# Patient Record
Sex: Female | Born: 1955 | Race: Black or African American | Hispanic: Yes | Smoking: Never smoker
Health system: Southern US, Community
[De-identification: ages and names within clinical notes are randomized; demographics above are authoritative.]

## PROBLEM LIST (undated history)

## (undated) DIAGNOSIS — I509 Heart failure, unspecified: Secondary | ICD-10-CM

## (undated) DIAGNOSIS — K219 Gastro-esophageal reflux disease without esophagitis: Secondary | ICD-10-CM

## (undated) DIAGNOSIS — I209 Angina pectoris, unspecified: Secondary | ICD-10-CM

## (undated) DIAGNOSIS — M1990 Unspecified osteoarthritis, unspecified site: Secondary | ICD-10-CM

## (undated) DIAGNOSIS — N189 Chronic kidney disease, unspecified: Secondary | ICD-10-CM

## (undated) DIAGNOSIS — I10 Essential (primary) hypertension: Secondary | ICD-10-CM

## (undated) DIAGNOSIS — E119 Type 2 diabetes mellitus without complications: Secondary | ICD-10-CM

## (undated) DIAGNOSIS — I2510 Atherosclerotic heart disease of native coronary artery without angina pectoris: Secondary | ICD-10-CM

## (undated) DIAGNOSIS — I219 Acute myocardial infarction, unspecified: Secondary | ICD-10-CM

## (undated) HISTORY — PX: EYE SURGERY: SHX253

## (undated) HISTORY — PX: CARDIAC CATHETERIZATION: SHX172

---

## 2006-04-06 DIAGNOSIS — Z90710 Acquired absence of both cervix and uterus: Secondary | ICD-10-CM | POA: Insufficient documentation

## 2007-04-30 ENCOUNTER — Inpatient Hospital Stay: Payer: Self-pay | Admitting: Internal Medicine

## 2007-04-30 ENCOUNTER — Other Ambulatory Visit: Payer: Self-pay

## 2009-03-18 HISTORY — PX: AV FISTULA PLACEMENT: SHX1204

## 2009-09-12 ENCOUNTER — Ambulatory Visit: Payer: Self-pay | Admitting: *Deleted

## 2009-09-15 ENCOUNTER — Ambulatory Visit: Payer: Self-pay | Admitting: *Deleted

## 2012-03-03 ENCOUNTER — Emergency Department: Payer: Self-pay | Admitting: Emergency Medicine

## 2012-06-24 DIAGNOSIS — N289 Disorder of kidney and ureter, unspecified: Secondary | ICD-10-CM | POA: Insufficient documentation

## 2013-08-16 DIAGNOSIS — I519 Heart disease, unspecified: Secondary | ICD-10-CM | POA: Insufficient documentation

## 2013-11-12 DIAGNOSIS — Z7682 Awaiting organ transplant status: Secondary | ICD-10-CM | POA: Insufficient documentation

## 2013-12-08 DIAGNOSIS — J309 Allergic rhinitis, unspecified: Secondary | ICD-10-CM | POA: Insufficient documentation

## 2013-12-08 DIAGNOSIS — J3489 Other specified disorders of nose and nasal sinuses: Secondary | ICD-10-CM | POA: Insufficient documentation

## 2015-07-11 ENCOUNTER — Other Ambulatory Visit: Payer: Self-pay | Admitting: Vascular Surgery

## 2015-07-11 NOTE — H&P (Signed)
  [facility] History & Physical Update  The patient was interviewed and re-examined.  The patient's previous History and Physical has been reviewed and is unchanged.  There is no change in the plan of care. We plan to proceed with the scheduled procedure.  Bexleigh Theriault, Dolores Lory, MD  07/11/2015, 5:42 PM

## 2015-07-12 ENCOUNTER — Ambulatory Visit
Admission: RE | Admit: 2015-07-12 | Discharge: 2015-07-12 | Disposition: A | Discharge status: Home / Self Care | Payer: Medicare Other | Source: Ambulatory Visit | Attending: Vascular Surgery | Admitting: Vascular Surgery

## 2015-07-12 ENCOUNTER — Encounter
Admission: RE | Disposition: A | Discharge status: Home / Self Care | Payer: Self-pay | Source: Ambulatory Visit | Attending: Vascular Surgery

## 2015-07-12 DIAGNOSIS — Z91041 Radiographic dye allergy status: Secondary | ICD-10-CM | POA: Diagnosis not present

## 2015-07-12 DIAGNOSIS — Z79899 Other long term (current) drug therapy: Secondary | ICD-10-CM | POA: Insufficient documentation

## 2015-07-12 DIAGNOSIS — E1122 Type 2 diabetes mellitus with diabetic chronic kidney disease: Secondary | ICD-10-CM | POA: Insufficient documentation

## 2015-07-12 DIAGNOSIS — Z7982 Long term (current) use of aspirin: Secondary | ICD-10-CM | POA: Insufficient documentation

## 2015-07-12 DIAGNOSIS — I509 Heart failure, unspecified: Secondary | ICD-10-CM | POA: Diagnosis not present

## 2015-07-12 DIAGNOSIS — Z992 Dependence on renal dialysis: Secondary | ICD-10-CM | POA: Insufficient documentation

## 2015-07-12 DIAGNOSIS — I252 Old myocardial infarction: Secondary | ICD-10-CM | POA: Diagnosis not present

## 2015-07-12 DIAGNOSIS — T82858A Stenosis of vascular prosthetic devices, implants and grafts, initial encounter: Secondary | ICD-10-CM | POA: Insufficient documentation

## 2015-07-12 DIAGNOSIS — I132 Hypertensive heart and chronic kidney disease with heart failure and with stage 5 chronic kidney disease, or end stage renal disease: Secondary | ICD-10-CM | POA: Insufficient documentation

## 2015-07-12 DIAGNOSIS — Z6832 Body mass index (BMI) 32.0-32.9, adult: Secondary | ICD-10-CM | POA: Diagnosis not present

## 2015-07-12 DIAGNOSIS — Y832 Surgical operation with anastomosis, bypass or graft as the cause of abnormal reaction of the patient, or of later complication, without mention of misadventure at the time of the procedure: Secondary | ICD-10-CM | POA: Diagnosis not present

## 2015-07-12 DIAGNOSIS — Z794 Long term (current) use of insulin: Secondary | ICD-10-CM | POA: Diagnosis not present

## 2015-07-12 DIAGNOSIS — N186 End stage renal disease: Secondary | ICD-10-CM | POA: Insufficient documentation

## 2015-07-12 DIAGNOSIS — Z91013 Allergy to seafood: Secondary | ICD-10-CM | POA: Insufficient documentation

## 2015-07-12 HISTORY — DX: Acute myocardial infarction, unspecified: I21.9

## 2015-07-12 HISTORY — DX: Essential (primary) hypertension: I10

## 2015-07-12 HISTORY — DX: Heart failure, unspecified: I50.9

## 2015-07-12 HISTORY — DX: Chronic kidney disease, unspecified: N18.9

## 2015-07-12 HISTORY — PX: PERIPHERAL VASCULAR CATHETERIZATION: SHX172C

## 2015-07-12 HISTORY — DX: Type 2 diabetes mellitus without complications: E11.9

## 2015-07-12 LAB — GLUCOSE, CAPILLARY: Glucose-Capillary: 231 mg/dL — ABNORMAL HIGH (ref 65–99)

## 2015-07-12 SURGERY — A/V SHUNTOGRAM/FISTULAGRAM
Anesthesia: Moderate Sedation

## 2015-07-12 MED ORDER — SODIUM CHLORIDE 0.9 % IV SOLN: INTRAVENOUS | Status: DC

## 2015-07-12 MED ORDER — MIDAZOLAM HCL 5 MG/5ML IJ SOLN
INTRAMUSCULAR | Status: AC
  Filled 2015-07-12: qty 5

## 2015-07-12 MED ORDER — FENTANYL CITRATE (PF) 100 MCG/2ML IJ SOLN
INTRAMUSCULAR | Status: DC | PRN
  Administered 2015-07-12: 50 ug via INTRAVENOUS

## 2015-07-12 MED ORDER — LIDOCAINE HCL (PF) 1 % IJ SOLN
INTRAMUSCULAR | Status: DC | PRN
  Administered 2015-07-12: 5 mL

## 2015-07-12 MED ORDER — IOPAMIDOL (ISOVUE-300) INJECTION 61%
INTRAVENOUS | Status: DC | PRN
  Administered 2015-07-12: 20 mL via INTRA_ARTERIAL

## 2015-07-12 MED ORDER — MIDAZOLAM HCL 2 MG/2ML IJ SOLN
INTRAMUSCULAR | Status: DC | PRN
  Administered 2015-07-12: 2 mg via INTRAVENOUS

## 2015-07-12 MED ORDER — DEXTROSE 5 % IV SOLN
1.5000 g | INTRAVENOUS | Status: AC
  Administered 2015-07-12: 1.5 g via INTRAVENOUS

## 2015-07-12 MED ORDER — FAMOTIDINE 20 MG PO TABS: 40.0000 mg | ORAL_TABLET | ORAL | Status: DC | PRN

## 2015-07-12 MED ORDER — ONDANSETRON HCL 4 MG/2ML IJ SOLN: 4.0000 mg | Freq: Four times a day (QID) | INTRAMUSCULAR | Status: DC | PRN

## 2015-07-12 MED ORDER — FENTANYL CITRATE (PF) 100 MCG/2ML IJ SOLN
INTRAMUSCULAR | Status: AC
  Filled 2015-07-12: qty 2

## 2015-07-12 MED ORDER — LIDOCAINE HCL (PF) 1 % IJ SOLN
INTRAMUSCULAR | Status: AC
  Filled 2015-07-12: qty 30

## 2015-07-12 MED ORDER — HEPARIN SODIUM (PORCINE) 1000 UNIT/ML IJ SOLN
INTRAMUSCULAR | Status: AC
  Filled 2015-07-12: qty 1

## 2015-07-12 MED ORDER — HEPARIN SODIUM (PORCINE) 1000 UNIT/ML IJ SOLN
INTRAMUSCULAR | Status: DC | PRN
  Administered 2015-07-12: 3000 [IU] via INTRAVENOUS

## 2015-07-12 MED ORDER — METHYLPREDNISOLONE SODIUM SUCC 125 MG IJ SOLR: 125.0000 mg | INTRAMUSCULAR | Status: DC | PRN

## 2015-07-12 MED ORDER — HYDROMORPHONE HCL 1 MG/ML IJ SOLN: 1.0000 mg | Freq: Once | INTRAMUSCULAR | Status: DC

## 2015-07-12 SURGICAL SUPPLY — 11 items
BALLN DORADO 8X60X80 (BALLOONS) ×3
BALLOON DORADO 8X60X80 (BALLOONS) ×1 IMPLANT
DEVICE PRESTO INFLATION (MISCELLANEOUS) ×3 IMPLANT
DRAPE BRACHIAL (DRAPES) ×3 IMPLANT
GRAFT STENT FLAIR ENDOVAS 9X50 (Permanent Stent) ×3 IMPLANT
PACK ANGIOGRAPHY (CUSTOM PROCEDURE TRAY) ×3 IMPLANT
SET INTRO CAPELLA COAXIAL (SET/KITS/TRAYS/PACK) ×3 IMPLANT
SHEATH BRITE TIP 6FRX5.5 (SHEATH) ×3 IMPLANT
SHEATH BRITE TIP 7FRX5.5 (SHEATH) ×3 IMPLANT
TOWEL OR 17X26 4PK STRL BLUE (TOWEL DISPOSABLE) ×3 IMPLANT
WIRE MAGIC TORQUE 260C (WIRE) ×3 IMPLANT

## 2015-07-12 NOTE — Discharge Instructions (Signed)
Fistulogram, Care After °Refer to this sheet in the next few weeks. These instructions provide you with information on caring for yourself after your procedure. Your health care provider may also give you more specific instructions. Your treatment has been planned according to current medical practices, but problems sometimes occur. Call your health care provider if you have any problems or questions after your procedure. °WHAT TO EXPECT AFTER THE PROCEDURE °After your procedure, it is typical to have the following: °· A small amount of discomfort in the area where the catheters were placed. °· A small amount of bruising around the fistula. °· Sleepiness and fatigue. °HOME CARE INSTRUCTIONS °· Rest at home for the day following your procedure. °· Do not drive or operate heavy machinery while taking pain medicine. °· Take medicines only as directed by your health care provider. °· Do not take baths, swim, or use a hot tub until your health care provider approves. You may shower 24 hours after the procedure or as directed by your health care provider. °· There are many different ways to close and cover an incision, including stitches, skin glue, and adhesive strips. Follow your health care provider's instructions on: °¨ Incision care. °¨ Bandage (dressing) changes and removal. °¨ Incision closure removal. °· Monitor your dialysis fistula carefully. °SEEK MEDICAL CARE IF: °· You have drainage, redness, swelling, or pain at your catheter site. °· You have a fever. °· You have chills. °SEEK IMMEDIATE MEDICAL CARE IF: °· You feel weak. °· You have trouble balancing. °· You have trouble moving your arms or legs. °· You have problems with your speech or vision. °· You can no longer feel a vibration or buzz when you put your fingers over your dialysis fistula. °· The limb that was used for the procedure: °¨ Swells. °¨ Is painful. °¨ Is cold. °¨ Is discolored, such as blue or pale white. °  °This information is not intended  to replace advice given to you by your health care provider. Make sure you discuss any questions you have with your health care provider. °  °Document Released: 07/19/2013 Document Reviewed: 07/19/2013 °Elsevier Interactive Patient Education ©2016 [employer] ° °

## 2015-07-12 NOTE — Op Note (Signed)
OPERATIVE NOTE   PROCEDURE: 1. Contrast injection left arm basilic transposition  AV access 2. Percutaneous transluminal angioplasty and stent placement venous outflow left basilic transposition  PRE-OPERATIVE DIAGNOSIS: Complication of dialysis access                                                       End Stage Renal Disease  POST-OPERATIVE DIAGNOSIS: same as above   SURGEON: Katha Cabal, M.D.  ANESTHESIA: Conscious Sedation   ESTIMATED BLOOD LOSS: minimal  FINDING(S): 1. 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 female who  presents with malfunctioning left arm AV access.  The patient is scheduled for angiography with possible intervention of the AV access.  The patient is aware the risks include but are not limited to: bleeding, infection, thrombosis of the cannulated access, and possible anaphylactic reaction to the contrast.  The patient acknowledges if the access can not be salvaged a tunneled catheter will be needed and will be placed during this procedure.  The patient is aware of the risks of the procedure and elects to proceed with the angiogram and intervention.  DESCRIPTION: After full informed written consent was obtained, the patient was brought back to the Special Procedure suite and placed supine position.  Appropriate cardiopulmonary monitors were placed.  The left arm was prepped and draped in the standard fashion.  Appropriate timeout is called. The left basilic transposition  was cannulated with a micropuncture needle in the antegrade direction.  The microwire was advanced and the needle was exchanged for  a microsheath.  The J-wire was then advanced and a 6 Fr sheath inserted.  Hand injections were completed to image the access from the arterial anastomosis through the entire access.  The central venous structures were  also imaged by hand injections.  Based on the images,  3000 units of heparin was given and a wire was negotiated through the strictures within the venous portion of the graft.  An 9 mm x 50 mm Flair Stent was deployed across the stenoses and postdilated with an 8 mm [place] balloon.  Follow-up imaging demonstrates complete resolution of the stricture with rapid flow of contrast through the graft, the central venous anatomy is preserved.  A 4-0 Monocryl purse-string suture was sewn around the sheath.  The sheath was removed and light pressure was applied.  A sterile bandage was applied to the puncture site.  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 The more proximal axillary vein subclavian and innominate superior vena cava widely patent. Reflux images demonstrates moderate stenosis at the origin of the fistula visualized portions of the brachial artery are widely patent. Anastomosis is widely patent.  Following and plasty and stent placement there is wide patency of the basilic brachial confluence and a marked improvement in the quality of the palpable thrill. Given her history of steal syndrome and prior banding I elected not to treat the arterial portion at this time.    COMPLICATIONS: None  CONDITION: Sally Lynch, M.D [facility] Office: [phone]  07/12/2015 9:07 AM

## 2015-07-20 NOTE — H&P (Signed)
[facility] Admission History & Physical  MRN : [id number]  Sally Lynch Sally Lynch is a 60 y.o. (03/06/1956) female who presents with chief complaint of problems with my fistula.  History of Present Illness: The patient is sent by her dialysis center for evaluation of her access.  She is having increased bleeding and worsening of her dialysis parameters.  No fever or chills.  She denies hand pain.  No current facility-administered medications for this encounter.   Current Outpatient Prescriptions  Medication Sig Dispense Refill  . aspirin 81 MG tablet Take 81 mg by mouth daily.    Marland Kitchen atorvastatin (LIPITOR) 40 MG tablet Take 40 mg by mouth daily.    . bumetanide (BUMEX) 1 MG tablet Take 1 mg by mouth 2 (two) times daily.    . cholecalciferol (VITAMIN D) 1000 units tablet Take 1,000 Units by mouth daily.    . cinacalcet (SENSIPAR) 60 MG tablet Take 60 mg by mouth daily.    . fluticasone (FLONASE) 50 MCG/ACT nasal spray Place into both nostrils 2 (two) times daily.    Marland Kitchen gabapentin (NEURONTIN) 300 MG capsule Take 300 mg by mouth daily.    . hydrOXYzine (ATARAX/VISTARIL) 25 MG tablet Take 25 mg by mouth 3 (three) times daily as needed.    . insulin glargine (LANTUS) 100 UNIT/ML injection Inject 20 Units into the skin at bedtime.    . metoprolol succinate (TOPROL-XL) 25 MG 24 hr tablet Take 25 mg by mouth 3 (three) times daily.    . pantoprazole (PROTONIX) 40 MG tablet Take 40 mg by mouth daily.    . sevelamer (RENAGEL) 800 MG tablet Take by mouth 3 (three) times daily with meals.      Past Medical History  Diagnosis Date  . Myocardial infarction ([place])   . CHF (congestive heart failure) ([place])   . Diabetes mellitus without complication ([place])   . Hypertension   . Chronic kidney disease     Past Surgical History  Procedure Laterality Date  . Cardiac catheterization    . Peripheral vascular catheterization N/A 07/12/2015    Procedure: A/V Shuntogram/Fistulagram;   Surgeon: Katha Cabal, MD;  Location: [facility];  Service: Cardiovascular;  Laterality: N/A;    Social History Social History  Substance Use Topics  . Smoking status: Never Smoker   . Smokeless tobacco: None  . Alcohol Use: No    Family History History reviewed. No pertinent family history.No family history of bleeding or clotting disorders, no porphyria, no autoimmune disease  Allergies  Allergen Reactions  . Shellfish Allergy Hives  . Contrast Media [Iodinated Diagnostic Agents] Hives     REVIEW OF SYSTEMS (Negative unless checked)  Constitutional: [] Weight loss  [] Fever  [] Chills Cardiac: [] Chest pain   [] Chest pressure   [] Palpitations   [] Shortness of breath when laying flat   [] Shortness of breath at rest   [] Shortness of breath with exertion. Vascular:  [] Pain in legs with walking   [] Pain in legs at rest   [] Pain in legs when laying flat   [] Claudication   [] Pain in feet when walking  [] Pain in feet at rest  [] Pain in feet when laying flat   [] History of DVT   [] Phlebitis   [] Swelling in legs   [] Varicose veins   [] Non-healing ulcers Pulmonary:   [] Uses home oxygen   [] Productive cough   [] Hemoptysis   [] Wheeze  [] COPD   [] Asthma Neurologic:  [] Dizziness  [] Blackouts   [] Seizures   []   History of stroke   [] History of TIA  [] Aphasia   [] Temporary blindness   [] Dysphagia   [] Weakness or numbness in arms   [] Weakness or numbness in legs Musculoskeletal:  [] Arthritis   [] Joint swelling   [] Joint pain   [] Low back pain Hematologic:  [] Easy bruising  [] Easy bleeding   [] Hypercoagulable state   [] Anemic  [] Hepatitis Gastrointestinal:  [] Blood in stool   [] Vomiting blood  [] Gastroesophageal reflux/heartburn   [] Difficulty swallowing. Genitourinary:  [] Chronic kidney disease   [] Difficult urination  [] Frequent urination  [] Burning with urination   [] Blood in urine Skin:  [] Rashes   [] Ulcers   [] Wounds Psychological:  [] History of anxiety   []  History of major  depression.  Physical Examination  Filed Vitals:   07/12/15 0930 07/12/15 0945 07/12/15 1000 07/12/15 1030  BP: 147/76 151/72 147/76 109/86  Pulse: 65 74 76 76  Temp:      TempSrc:      Resp: 14 18 14 16   Height:      Weight:      SpO2: 78%      Body mass index is 32.08 kg/(m^2). Gen: WD/WN, NAD Head: [place]/AT, No temporalis wasting.  Ear/Nose/Throat: Hearing grossly intact, nares w/o erythema or drainage, oropharynx w/o Erythema/Exudate, Eyes: PERRLA, EOMI.  Neck: Supple, no nuchal rigidity.  No bruit or JVD.  Pulmonary:  Good air movement, clear to auscultation bilaterally, no increased work of respiration or use of accessory muscles  Cardiac: RRR, normal S1, S2, no Murmurs, rubs or gallops. Vascular: left basilic transposition markedly pulsatile  Vessel Right Left  Radial Palpable Palpable  Ulnar Palpable Palpable  Brachial Palpable Palpable  Carotid Palpable, without bruit Palpable, without bruit  Aorta Not palpable N/A  Femoral Palpable Palpable  Popliteal Palpable Palpable  PT Palpable Palpable  DP Palpable Palpable   Gastrointestinal: soft, non-tender/non-distended. No guarding/reflex. No masses, surgical incisions, or scars. Musculoskeletal: M/S 5/5 throughout.  No deformity or atrophy. Neurologic: CN 2-12 intact. Pain and light touch intact in extremities.  Symmetrical.  Speech is fluent. Motor exam as listed above. Psychiatric: Judgment intact, Mood & affect appropriate for pt's clinical situation. Dermatologic: No rashes or ulcers noted.  No cellulitis or open wounds. Lymph : No Cervical, Axillary, or Inguinal lymphadenopathy.   CBC No results found for: WBC, HGB, HCT, MCV, PLT  BMET No results found for: NA, K, CL, CO2, GLUCOSE, BUN, CREATININE, CALCIUM, GFRNONAA, GFRAA CrCl cannot be calculated (Patient has no serum creatinine result on file.).  COAG No results found for: INR, PROTIME  Radiology No results found.  Assessment/Plan  1.  Complication  dialysis device with poor function AV access:  Patient's left arm dialysis access is functioning poorly. The patient will undergo angiography and treatment using interventional techniques. Potassium will be drawn to ensure that it is an appropriate level prior to performing intervention. 2.  End-stage renal disease requiring hemodialysis:  Patient will continue dialysis therapy without further interruption if a successful intervention is not achieved then catheter will be placed. Dialysis has already been arranged since the patient missed their previous session 3.  Hypertension:  Patient will continue medical management; nephrology is following no changes in oral medications. 4. Diabetes mellitus:  Glucose will be monitored and oral medications been held this morning once the patient has undergone the patient's procedure po intake will be reinitiated and again Accu-Cheks will be used to assess the blood glucose level and treat as needed. The patient will be restarted on the patient's usual hypoglycemic regime  Sally Lynch, Dolores Lory, MD  07/20/2015 4:09 PM

## 2015-07-24 DIAGNOSIS — E1165 Type 2 diabetes mellitus with hyperglycemia: Secondary | ICD-10-CM | POA: Insufficient documentation

## 2016-01-10 ENCOUNTER — Other Ambulatory Visit (INDEPENDENT_AMBULATORY_CARE_PROVIDER_SITE_OTHER): Payer: Self-pay | Admitting: Vascular Surgery

## 2016-01-10 DIAGNOSIS — Z992 Dependence on renal dialysis: Secondary | ICD-10-CM

## 2016-01-10 DIAGNOSIS — T829XXA Unspecified complication of cardiac and vascular prosthetic device, implant and graft, initial encounter: Secondary | ICD-10-CM

## 2016-01-10 DIAGNOSIS — N186 End stage renal disease: Secondary | ICD-10-CM

## 2016-01-11 ENCOUNTER — Ambulatory Visit (INDEPENDENT_AMBULATORY_CARE_PROVIDER_SITE_OTHER): Payer: Self-pay | Admitting: Vascular Surgery

## 2016-01-11 ENCOUNTER — Ambulatory Visit (INDEPENDENT_AMBULATORY_CARE_PROVIDER_SITE_OTHER): Payer: Medicare Other

## 2016-01-11 DIAGNOSIS — Z992 Dependence on renal dialysis: Secondary | ICD-10-CM

## 2016-01-11 DIAGNOSIS — T829XXA Unspecified complication of cardiac and vascular prosthetic device, implant and graft, initial encounter: Secondary | ICD-10-CM

## 2016-01-11 DIAGNOSIS — N186 End stage renal disease: Secondary | ICD-10-CM | POA: Diagnosis not present

## 2016-01-18 ENCOUNTER — Encounter (INDEPENDENT_AMBULATORY_CARE_PROVIDER_SITE_OTHER): Payer: Self-pay | Admitting: Vascular Surgery

## 2016-01-18 ENCOUNTER — Ambulatory Visit (INDEPENDENT_AMBULATORY_CARE_PROVIDER_SITE_OTHER): Payer: Medicare Other | Admitting: Vascular Surgery

## 2016-01-18 DIAGNOSIS — E782 Mixed hyperlipidemia: Secondary | ICD-10-CM | POA: Diagnosis not present

## 2016-01-18 DIAGNOSIS — T829XXA Unspecified complication of cardiac and vascular prosthetic device, implant and graft, initial encounter: Secondary | ICD-10-CM | POA: Insufficient documentation

## 2016-01-18 DIAGNOSIS — E119 Type 2 diabetes mellitus without complications: Secondary | ICD-10-CM

## 2016-01-18 DIAGNOSIS — I10 Essential (primary) hypertension: Secondary | ICD-10-CM

## 2016-01-18 DIAGNOSIS — T829XXS Unspecified complication of cardiac and vascular prosthetic device, implant and graft, sequela: Secondary | ICD-10-CM

## 2016-01-18 DIAGNOSIS — N186 End stage renal disease: Secondary | ICD-10-CM | POA: Diagnosis not present

## 2016-01-18 DIAGNOSIS — Z794 Long term (current) use of insulin: Secondary | ICD-10-CM

## 2016-01-18 DIAGNOSIS — E785 Hyperlipidemia, unspecified: Secondary | ICD-10-CM | POA: Insufficient documentation

## 2016-02-23 NOTE — Progress Notes (Signed)
MRN : [id number]  Sally Lynch is a 60 y.o. ([birth year]) female who presents with chief complaint of  Chief Complaint  Patient presents with  . Re-evaluation    Follow up on HDA, questions  .  History of Present Illness: The patient returns to the office for followup of their dialysis access. The function of the access has been stable. The patient denies increased bleeding time or increased recirculation. Patient denies difficulty with cannulation. The patient denies hand pain or other symptoms consistent with steal phenomena.  No significant arm swelling.  The patient denies redness or swelling at the access site. The patient denies fever or chills at home or while on dialysis.  The patient denies amaurosis fugax or recent TIA symptoms. There are no recent neurological changes noted. The patient denies claudication symptoms or rest pain symptoms. The patient denies history of DVT, PE or superficial thrombophlebitis. The patient denies recent episodes of angina or shortness of breath.        Current Meds  Medication Sig  . aspirin 81 MG tablet Take 81 mg by mouth daily.  Marland Kitchen atorvastatin (LIPITOR) 20 MG tablet Take 20 mg by mouth daily.  . cholecalciferol (VITAMIN D) 1000 units tablet Take 1,000 Units by mouth daily.  . cinacalcet (SENSIPAR) 90 MG tablet Take 90 mg by mouth daily.  . fluticasone (FLONASE) 50 MCG/ACT nasal spray Place into both nostrils 2 (two) times daily.  Marland Kitchen gabapentin (NEURONTIN) 300 MG capsule Take 300 mg by mouth daily.  . hydrOXYzine (ATARAX/VISTARIL) 25 MG tablet Take 25 mg by mouth 3 (three) times daily as needed.  . insulin glargine (LANTUS) 100 UNIT/ML injection Inject 20 Units into the skin at bedtime.  Marland Kitchen losartan (COZAAR) 25 MG tablet Take 25 mg by mouth daily.  . metoprolol succinate (TOPROL-XL) 25 MG 24 hr tablet Take 25 mg by mouth 3 (three) times daily.  . pantoprazole (PROTONIX) 40 MG tablet Take 40 mg by mouth daily.  . sevelamer  (RENAGEL) 800 MG tablet Take by mouth 3 (three) times daily with meals.    Past Medical History:  Diagnosis Date  . CHF (congestive heart failure) ([place])   . Chronic kidney disease   . Diabetes mellitus without complication ([place])   . Hypertension   . Myocardial infarction     Past Surgical History:  Procedure Laterality Date  . CARDIAC CATHETERIZATION    . PERIPHERAL VASCULAR CATHETERIZATION N/A 07/12/2015   Procedure: A/V Shuntogram/Fistulagram;  Surgeon: Katha Cabal, MD;  Location: [facility];  Service: Cardiovascular;  Laterality: N/A;    Social History Social History  Substance Use Topics  . Smoking status: Never Smoker  . Smokeless tobacco: Not on file  . Alcohol use No    Family History No family history on file. No family history of bleeding/clotting disorders, porphyria or autoimmune disease   Allergies  Allergen Reactions  . Shellfish Allergy Hives  . Contrast Media [Iodinated Diagnostic Agents] Hives  . Furosemide Rash  . Other Rash    Raw fruits  . Peanut Oil Rash     REVIEW OF SYSTEMS (Negative unless checked)  Constitutional: [] Weight loss  [] Fever  [] Chills Cardiac: [] Chest pain   [] Chest pressure   [] Palpitations   [] Shortness of breath when laying flat   [] Shortness of breath with exertion. Vascular:  [] Pain in legs with walking   [] Pain in legs at rest  [] History of DVT   [] Phlebitis   [] Swelling in legs   []   Varicose veins   [] Non-healing ulcers Pulmonary:   [] Uses home oxygen   [] Productive cough   [] Hemoptysis   [] Wheeze  [] COPD   [] Asthma Neurologic:  [] Dizziness   [] Seizures   [] History of stroke   [] History of TIA  [] Aphasia   [] Vissual changes   [] Weakness or numbness in arm   [] Weakness or numbness in leg Musculoskeletal:   [] Joint swelling   [] Joint pain   [] Low back pain Hematologic:  [] Easy bruising  [] Easy bleeding   [] Hypercoagulable state   [] Anemic Gastrointestinal:  [] Diarrhea   [] Vomiting  [] Gastroesophageal  reflux/heartburn   [] Difficulty swallowing. Genitourinary:  [x] Chronic kidney disease   [] Difficult urination  [] Frequent urination   [] Blood in urine Skin:  [] Rashes   [] Ulcers  Psychological:  [] History of anxiety   []  History of major depression.  Physical Examination  Vitals:   01/18/16 0925  BP: (!) 144/79  Pulse: 71  Resp: 16  Weight: 180 lb (81.6 kg)  Height: 5\' 4"  (1.626 m)   Body mass index is 30.9 kg/m. Gen: WD/WN, NAD Head: [place]/AT, No temporalis wasting.  Ear/Nose/Throat: Hearing grossly intact, nares w/o erythema or drainage, poor dentition Eyes: PER, EOMI, sclera nonicteric.  Neck: Supple, no masses.  No bruit or JVD.  Pulmonary:  Good air movement, clear to auscultation bilaterally, no use of accessory muscles.  Cardiac: RRR, normal S1, S2, no Murmurs. Vascular: AV graft good thrill and good bruit Vessel Right Left  Radial Palpable Palpable  Ulnar Palpable Palpable  Brachial Palpable Palpable  Gastrointestinal: soft, non-distended. No guarding/no peritoneal signs.  Musculoskeletal: M/S 5/5 throughout.  No deformity or atrophy.  Neurologic: CN 2-12 intact. Pain and light touch intact in extremities.  Symmetrical.  Speech is fluent. Motor exam as listed above. Psychiatric: Judgment intact, Mood & affect appropriate for pt's clinical situation. Dermatologic: No rashes or ulcers noted.  No changes consistent with cellulitis. Lymph : No Cervical lymphadenopathy, no lichenification or skin changes of chronic lymphedema.  CBC No results found for: WBC, HGB, HCT, MCV, PLT  BMET No results found for: NA, K, CL, CO2, GLUCOSE, BUN, CREATININE, CALCIUM, GFRNONAA, GFRAA CrCl cannot be calculated (No order found.).  COAG No results found for: INR, PROTIME  Radiology No results found.   Assessment/Plan 1. End stage renal disease ([place]) Recommend:  The patient is doing well and currently has adequate dialysis access. The patient's dialysis center is not reporting  any access issues. Flow pattern is stable when compared to the prior ultrasound.  The patient should have a duplex ultrasound of the dialysis access in 6 months.  The patient will follow-up with me in the office after each ultrasound   2. Complication of vascular access for dialysis, sequela See #1  3. Essential hypertension Continue antihypertensive medications as already ordered and reviewed, no changes at this time.  4. Type 2 diabetes mellitus treated with insulin ([place]) Continue hypoglycemic medications as already ordered and reviewed, no changes at this time.  5. Mixed hyperlipidemia Continue statin as ordered and reviewed, no changes at this time   Hortencia Pilar, MD  02/23/2016 3:58 PM

## 2016-04-19 DIAGNOSIS — S82132A Displaced fracture of medial condyle of left tibia, initial encounter for closed fracture: Secondary | ICD-10-CM | POA: Insufficient documentation

## 2016-06-14 ENCOUNTER — Other Ambulatory Visit
Admission: RE | Admit: 2016-06-14 | Discharge: 2016-06-14 | Disposition: A | Discharge status: Home / Self Care | Payer: Medicare Other | Source: Other Acute Inpatient Hospital | Attending: Nephrology | Admitting: Nephrology

## 2016-06-14 DIAGNOSIS — E875 Hyperkalemia: Secondary | ICD-10-CM | POA: Diagnosis present

## 2016-06-14 LAB — POTASSIUM: Potassium: 6.7 mmol/L (ref 3.5–5.1)

## 2016-06-17 ENCOUNTER — Other Ambulatory Visit
Admission: RE | Admit: 2016-06-17 | Discharge: 2016-06-17 | Disposition: A | Discharge status: Home / Self Care | Payer: Medicare Other | Source: Ambulatory Visit | Attending: Nephrology | Admitting: Nephrology

## 2016-06-17 DIAGNOSIS — E875 Hyperkalemia: Secondary | ICD-10-CM | POA: Insufficient documentation

## 2016-06-17 LAB — POTASSIUM: Potassium: 7 mmol/L (ref 3.5–5.1)

## 2016-06-24 ENCOUNTER — Other Ambulatory Visit
Admission: RE | Admit: 2016-06-24 | Discharge: 2016-06-24 | Disposition: A | Discharge status: Home / Self Care | Payer: Medicare Other | Source: Ambulatory Visit | Attending: Nephrology | Admitting: Nephrology

## 2016-06-24 LAB — POTASSIUM: POTASSIUM: 6.5 mmol/L — AB (ref 3.5–5.1)

## 2016-07-17 ENCOUNTER — Other Ambulatory Visit (INDEPENDENT_AMBULATORY_CARE_PROVIDER_SITE_OTHER): Payer: Self-pay | Admitting: Vascular Surgery

## 2016-07-17 DIAGNOSIS — T8249XA Other complication of vascular dialysis catheter, initial encounter: Secondary | ICD-10-CM

## 2016-07-17 DIAGNOSIS — N186 End stage renal disease: Secondary | ICD-10-CM

## 2016-07-18 ENCOUNTER — Ambulatory Visit (INDEPENDENT_AMBULATORY_CARE_PROVIDER_SITE_OTHER): Payer: Medicare Other | Admitting: Vascular Surgery

## 2016-07-18 ENCOUNTER — Encounter (INDEPENDENT_AMBULATORY_CARE_PROVIDER_SITE_OTHER): Payer: Medicare Other

## 2016-08-08 ENCOUNTER — Ambulatory Visit (INDEPENDENT_AMBULATORY_CARE_PROVIDER_SITE_OTHER): Payer: Medicare Other | Admitting: Vascular Surgery

## 2016-08-08 ENCOUNTER — Encounter (INDEPENDENT_AMBULATORY_CARE_PROVIDER_SITE_OTHER): Payer: Medicare Other

## 2016-09-26 ENCOUNTER — Ambulatory Visit (INDEPENDENT_AMBULATORY_CARE_PROVIDER_SITE_OTHER): Payer: Medicare Other | Admitting: Vascular Surgery

## 2016-09-26 ENCOUNTER — Encounter (INDEPENDENT_AMBULATORY_CARE_PROVIDER_SITE_OTHER): Payer: Medicare Other

## 2016-10-08 ENCOUNTER — Ambulatory Visit (INDEPENDENT_AMBULATORY_CARE_PROVIDER_SITE_OTHER): Payer: Medicare Other | Admitting: Vascular Surgery

## 2016-10-08 ENCOUNTER — Encounter (INDEPENDENT_AMBULATORY_CARE_PROVIDER_SITE_OTHER): Payer: Medicare Other

## 2017-01-24 ENCOUNTER — Other Ambulatory Visit
Admission: RE | Admit: 2017-01-24 | Discharge: 2017-01-24 | Disposition: A | Discharge status: Home / Self Care | Payer: Medicare Other | Source: Ambulatory Visit | Attending: Nephrology | Admitting: Nephrology

## 2017-01-24 DIAGNOSIS — E875 Hyperkalemia: Secondary | ICD-10-CM | POA: Insufficient documentation

## 2017-01-24 LAB — POTASSIUM: POTASSIUM: 6.3 mmol/L — AB (ref 3.5–5.1)

## 2017-08-01 DIAGNOSIS — K219 Gastro-esophageal reflux disease without esophagitis: Secondary | ICD-10-CM | POA: Insufficient documentation

## 2017-10-22 ENCOUNTER — Other Ambulatory Visit (INDEPENDENT_AMBULATORY_CARE_PROVIDER_SITE_OTHER): Payer: Self-pay | Admitting: Vascular Surgery

## 2017-10-22 DIAGNOSIS — I739 Peripheral vascular disease, unspecified: Secondary | ICD-10-CM

## 2017-10-23 ENCOUNTER — Encounter (INDEPENDENT_AMBULATORY_CARE_PROVIDER_SITE_OTHER): Payer: Self-pay | Admitting: Vascular Surgery

## 2017-10-23 ENCOUNTER — Ambulatory Visit (INDEPENDENT_AMBULATORY_CARE_PROVIDER_SITE_OTHER): Payer: Medicare Other

## 2017-10-23 ENCOUNTER — Ambulatory Visit (INDEPENDENT_AMBULATORY_CARE_PROVIDER_SITE_OTHER): Payer: Medicare Other | Admitting: Vascular Surgery

## 2017-10-23 VITALS — BP 128/72 | HR 71 | Resp 12 | Ht 64.0 in | Wt 194.0 lb

## 2017-10-23 DIAGNOSIS — I739 Peripheral vascular disease, unspecified: Secondary | ICD-10-CM | POA: Diagnosis not present

## 2017-10-23 DIAGNOSIS — T829XXS Unspecified complication of cardiac and vascular prosthetic device, implant and graft, sequela: Secondary | ICD-10-CM | POA: Diagnosis not present

## 2017-10-23 DIAGNOSIS — N186 End stage renal disease: Secondary | ICD-10-CM | POA: Diagnosis not present

## 2017-10-23 NOTE — Progress Notes (Signed)
Subjective:    Patient ID: Sally Lynch, female    DOB: Mar 21, 1955, 62 y.o.   MRN: [id number] Chief Complaint  Patient presents with  . Follow-up    HDA [place] follow up   Patient last seen in November 2017.  Patient presents at the request of her dialysis center for a poorly functioning fistula. Patient notes having issues with cannulation and recirculation. The patient also notes at higher dialysis rates she experiences left hand pain.  The patient underwent a left upper extremity dialysis access duplex which was notable for a total flow volume of 1126, patent left brachial basilic AV fistula with outflow vein velocities less than 100.  The patient's access is aneurysmal however she denies any pain or bleeding from the site.  Denies any fever, nausea vomiting.  Review of Systems  Constitutional: Negative.   HENT: Negative.   Eyes: Negative.   Respiratory: Negative.   Cardiovascular: Negative.   Gastrointestinal: Negative.   Endocrine: Negative.   Genitourinary:       ESRD  Musculoskeletal: Negative.   Skin: Negative.   Allergic/Immunologic: Negative.   Neurological: Negative.   Hematological: Negative.   Psychiatric/Behavioral: Negative.       Objective:   Physical Exam  Constitutional: She is oriented to person, place, and time. She appears well-developed and well-nourished. No distress.  HENT:  Head: Normocephalic and atraumatic.  Right Ear: External ear normal.  Left Ear: External ear normal.  Eyes: Pupils are equal, round, and reactive to light. Conjunctivae and EOM are normal.  Neck: Normal range of motion.  Cardiovascular: Normal rate, regular rhythm, normal heart sounds and intact distal pulses.  Pulses:      Radial pulses are 2+ on the right side, and 1+ on the left side.  Left upper extremity dialysis access site: Aneurysmal but skin is intact.  No signs of skin threatening noted.  Good bruit and thrill noted.  Pulmonary/Chest: Effort normal and breath sounds  normal.  Musculoskeletal: Normal range of motion. She exhibits no edema.  Neurological: She is alert and oriented to person, place, and time.  Skin: Skin is warm and dry. She is not diaphoretic.  Psychiatric: She has a normal mood and affect. Her behavior is normal. Judgment and thought content normal.   BP 128/72 (BP Location: Right Arm, Patient Position: Sitting)   Pulse 71   Resp 12   Ht 5\' 4"  (1.626 m)   Wt 194 lb (88 kg)   BMI 33.30 kg/m   Past Medical History:  Diagnosis Date  . CHF (congestive heart failure) ([place])   . Chronic kidney disease   . Diabetes mellitus without complication ([place])   . Hypertension   . Myocardial infarction [facility])    Social History   Socioeconomic History  . Marital status: Widowed    Spouse name: Not on file  . Number of children: Not on file  . Years of education: Not on file  . Highest education level: Not on file  Occupational History  . Not on file  Social Needs  . Financial resource strain: Not on file  . Food insecurity:    Worry: Not on file    Inability: Not on file  . Transportation needs:    Medical: Not on file    Non-medical: Not on file  Tobacco Use  . Smoking status: Never Smoker  . Smokeless tobacco: Never Used  Substance and Sexual Activity  . Alcohol use: No  . Drug use: No  .  Sexual activity: Never  Lifestyle  . Physical activity:    Days per week: Not on file    Minutes per session: Not on file  . Stress: Not on file  Relationships  . Social connections:    Talks on phone: Not on file    Gets together: Not on file    Attends religious service: Not on file    Active member of club or organization: Not on file    Attends meetings of clubs or organizations: Not on file    Relationship status: Not on file  . Intimate partner violence:    Fear of current or ex partner: Not on file    Emotionally abused: Not on file    Physically abused: Not on file    Forced sexual activity: Not on file  Other Topics Concern   . Not on file  Social History Narrative  . Not on file   Past Surgical History:  Procedure Laterality Date  . CARDIAC CATHETERIZATION    . PERIPHERAL VASCULAR CATHETERIZATION N/A 07/12/2015   Procedure: A/V Shuntogram/Fistulagram;  Surgeon: Katha Cabal, MD;  Location: [place] [facility];  Service: Cardiovascular;  Laterality: N/A;   History reviewed. No pertinent family history.  Allergies  Allergen Reactions  . Shellfish Allergy Hives  . Contrast Media [Iodinated Diagnostic Agents] Hives  . Furosemide Rash  . Other Rash    Raw fruits  . Peanut Oil Rash      Assessment & Plan:  Patient last seen in November 2017.  Patient presents at the request of her dialysis center for a poorly functioning fistula. Patient notes having issues with cannulation and recirculation. The patient also notes at higher dialysis rates she experiences left hand pain.  The patient underwent a left upper extremity dialysis access duplex which was notable for a total flow volume of 1126, patent left brachial basilic AV fistula with outflow vein velocities less than 100.  The patient's access is aneurysmal however she denies any pain or bleeding from the site.  Denies any fever, nausea vomiting.  1. End stage renal disease ([place]) - Stable Patient with progressively worsening issues with cannulation, recirculation and left hand pain during dialysis. HDA with outflow vein velocities less than 100. Recommend a left upper extremity fistulogram with possible intervention in an attempt to assess the patient's anatomy and restore function to access Procedure, risks and benefits explained to the patient Questions answered Patient wishes to proceed Patient would like to wait approximately a month and a half to undergo the fistulogram as she is undergoing a cardiac work-up for a recent MI  2. Complication of vascular access for dialysis, sequela - Stable As Above  Current Outpatient Medications on File Prior  to Visit  Medication Sig Dispense Refill  . aspirin 81 MG tablet Take 81 mg by mouth daily.    Marland Kitchen atorvastatin (LIPITOR) 20 MG tablet Take 20 mg by mouth daily.    . calcitRIOL (ROCALTROL) 0.25 MCG capsule Take by mouth.    . cinacalcet (SENSIPAR) 90 MG tablet Take 90 mg by mouth daily.    . clopidogrel (PLAVIX) 75 MG tablet Take 75 mg by mouth daily.    Marland Kitchen gabapentin (NEURONTIN) 300 MG capsule Take 300 mg by mouth daily.    . hydrOXYzine (ATARAX/VISTARIL) 25 MG tablet Take 25 mg by mouth 3 (three) times daily as needed.    . insulin glargine (LANTUS) 100 UNIT/ML injection Inject 20 Units into the skin at bedtime.    Marland Kitchen  lidocaine-prilocaine (EMLA) cream Apply topically.    Marland Kitchen losartan (COZAAR) 25 MG tablet Take 25 mg by mouth daily.    . metoprolol succinate (TOPROL-XL) 25 MG 24 hr tablet Take 12.5 mg by mouth 3 (three) times daily.     . midodrine (PROAMATINE) 5 MG tablet Take 1 tab 1/2 hr before dialysis, take 1 tab mid treatment prn hypotension    . pantoprazole (PROTONIX) 40 MG tablet Take 40 mg by mouth daily.    . sevelamer carbonate (RENVELA) 800 MG tablet Take 2 tabs for 3 meals per day and 1 tabs for 2 snacks per day    . triamcinolone cream (KENALOG) 0.1 % Mix 1:1 w Eucerin cream.  Apply to dry itchy skin twice daily prn    . cholecalciferol (VITAMIN D) 1000 units tablet Take 1,000 Units by mouth daily.    . fluticasone (FLONASE) 50 MCG/ACT nasal spray Place into both nostrils 2 (two) times daily.     No current facility-administered medications on file prior to visit.    There are no Patient Instructions on file for this visit. No follow-ups on file.  KIMBERLY A STEGMAYER, PA-C

## 2017-11-05 ENCOUNTER — Encounter (INDEPENDENT_AMBULATORY_CARE_PROVIDER_SITE_OTHER): Payer: Self-pay

## 2017-11-06 ENCOUNTER — Other Ambulatory Visit (INDEPENDENT_AMBULATORY_CARE_PROVIDER_SITE_OTHER): Payer: Self-pay | Admitting: Nurse Practitioner

## 2017-12-01 ENCOUNTER — Encounter (INDEPENDENT_AMBULATORY_CARE_PROVIDER_SITE_OTHER): Payer: Self-pay

## 2017-12-01 ENCOUNTER — Telehealth (INDEPENDENT_AMBULATORY_CARE_PROVIDER_SITE_OTHER): Payer: Self-pay

## 2017-12-01 NOTE — Telephone Encounter (Signed)
Patient called back and wanted to be scheduled on 10/22.

## 2017-12-01 NOTE — Telephone Encounter (Signed)
Called the patient and left a message regarding her fistulagram with Dr. Delana Meyer tomorrow. She has been rescheduled to 12/09/17 with a 1:30 pm arrival due to an unforseen issue. I have attempted to contact the patient and had to leave a message for a return call. I also contacted the dialysis center she attends.

## 2018-01-05 MED ORDER — CEFAZOLIN SODIUM-DEXTROSE 1-4 GM/50ML-% IV SOLN
1.0000 g | Freq: Once | INTRAVENOUS | Status: AC
  Administered 2018-01-06: 1 g via INTRAVENOUS

## 2018-01-06 ENCOUNTER — Encounter: Payer: Self-pay | Admitting: *Deleted

## 2018-01-06 ENCOUNTER — Encounter
Admission: RE | Disposition: A | Discharge status: Home / Self Care | Payer: Self-pay | Source: Ambulatory Visit | Attending: Vascular Surgery

## 2018-01-06 ENCOUNTER — Ambulatory Visit
Admission: RE | Admit: 2018-01-06 | Discharge: 2018-01-06 | Disposition: A | Discharge status: Home / Self Care | Payer: Medicare Other | Source: Ambulatory Visit | Attending: Vascular Surgery | Admitting: Vascular Surgery

## 2018-01-06 DIAGNOSIS — Y832 Surgical operation with anastomosis, bypass or graft as the cause of abnormal reaction of the patient, or of later complication, without mention of misadventure at the time of the procedure: Secondary | ICD-10-CM | POA: Diagnosis not present

## 2018-01-06 DIAGNOSIS — Z91013 Allergy to seafood: Secondary | ICD-10-CM | POA: Diagnosis not present

## 2018-01-06 DIAGNOSIS — E1122 Type 2 diabetes mellitus with diabetic chronic kidney disease: Secondary | ICD-10-CM | POA: Insufficient documentation

## 2018-01-06 DIAGNOSIS — Z91010 Allergy to peanuts: Secondary | ICD-10-CM | POA: Diagnosis not present

## 2018-01-06 DIAGNOSIS — I120 Hypertensive chronic kidney disease with stage 5 chronic kidney disease or end stage renal disease: Secondary | ICD-10-CM | POA: Diagnosis not present

## 2018-01-06 DIAGNOSIS — K219 Gastro-esophageal reflux disease without esophagitis: Secondary | ICD-10-CM | POA: Insufficient documentation

## 2018-01-06 DIAGNOSIS — T82858A Stenosis of vascular prosthetic devices, implants and grafts, initial encounter: Secondary | ICD-10-CM | POA: Diagnosis present

## 2018-01-06 DIAGNOSIS — M1990 Unspecified osteoarthritis, unspecified site: Secondary | ICD-10-CM | POA: Diagnosis not present

## 2018-01-06 DIAGNOSIS — Z91018 Allergy to other foods: Secondary | ICD-10-CM | POA: Diagnosis not present

## 2018-01-06 DIAGNOSIS — I252 Old myocardial infarction: Secondary | ICD-10-CM | POA: Diagnosis not present

## 2018-01-06 DIAGNOSIS — Z91041 Radiographic dye allergy status: Secondary | ICD-10-CM | POA: Diagnosis not present

## 2018-01-06 DIAGNOSIS — N186 End stage renal disease: Secondary | ICD-10-CM | POA: Diagnosis not present

## 2018-01-06 DIAGNOSIS — I2510 Atherosclerotic heart disease of native coronary artery without angina pectoris: Secondary | ICD-10-CM | POA: Insufficient documentation

## 2018-01-06 DIAGNOSIS — T82868A Thrombosis of vascular prosthetic devices, implants and grafts, initial encounter: Secondary | ICD-10-CM

## 2018-01-06 DIAGNOSIS — I509 Heart failure, unspecified: Secondary | ICD-10-CM | POA: Insufficient documentation

## 2018-01-06 DIAGNOSIS — I132 Hypertensive heart and chronic kidney disease with heart failure and with stage 5 chronic kidney disease, or end stage renal disease: Secondary | ICD-10-CM | POA: Diagnosis not present

## 2018-01-06 DIAGNOSIS — Z9889 Other specified postprocedural states: Secondary | ICD-10-CM | POA: Diagnosis not present

## 2018-01-06 DIAGNOSIS — Z888 Allergy status to other drugs, medicaments and biological substances status: Secondary | ICD-10-CM | POA: Insufficient documentation

## 2018-01-06 DIAGNOSIS — Z992 Dependence on renal dialysis: Secondary | ICD-10-CM | POA: Diagnosis not present

## 2018-01-06 HISTORY — DX: Atherosclerotic heart disease of native coronary artery without angina pectoris: I25.10

## 2018-01-06 HISTORY — DX: Unspecified osteoarthritis, unspecified site: M19.90

## 2018-01-06 HISTORY — PX: A/V FISTULAGRAM: CATH118298

## 2018-01-06 HISTORY — DX: Angina pectoris, unspecified: I20.9

## 2018-01-06 HISTORY — DX: Gastro-esophageal reflux disease without esophagitis: K21.9

## 2018-01-06 LAB — POTASSIUM (ARMC VASCULAR LAB ONLY): Potassium (ARMC vascular lab): 4.4 (ref 3.5–5.1)

## 2018-01-06 LAB — GLUCOSE, CAPILLARY
GLUCOSE-CAPILLARY: 117 mg/dL — AB (ref 70–99)
Glucose-Capillary: 126 mg/dL — ABNORMAL HIGH (ref 70–99)

## 2018-01-06 SURGERY — A/V FISTULAGRAM
Anesthesia: Moderate Sedation | Laterality: Left

## 2018-01-06 MED ORDER — DIPHENHYDRAMINE HCL 50 MG/ML IJ SOLN
25.0000 mg | Freq: Once | INTRAMUSCULAR | Status: AC
  Administered 2018-01-06: 25 mg via INTRAVENOUS

## 2018-01-06 MED ORDER — MIDAZOLAM HCL 5 MG/5ML IJ SOLN
INTRAMUSCULAR | Status: AC
  Filled 2018-01-06: qty 5

## 2018-01-06 MED ORDER — FENTANYL CITRATE (PF) 100 MCG/2ML IJ SOLN
INTRAMUSCULAR | Status: DC | PRN
  Administered 2018-01-06: 50 ug via INTRAVENOUS

## 2018-01-06 MED ORDER — HEPARIN SODIUM (PORCINE) 1000 UNIT/ML IJ SOLN
INTRAMUSCULAR | Status: DC | PRN
  Administered 2018-01-06: 3000 [IU] via INTRAVENOUS

## 2018-01-06 MED ORDER — ONDANSETRON HCL 4 MG/2ML IJ SOLN: 4.0000 mg | Freq: Four times a day (QID) | INTRAMUSCULAR | Status: DC | PRN

## 2018-01-06 MED ORDER — FAMOTIDINE 20 MG PO TABS
40.0000 mg | ORAL_TABLET | Freq: Once | ORAL | Status: AC
  Administered 2018-01-06: 40 mg via ORAL

## 2018-01-06 MED ORDER — DIPHENHYDRAMINE HCL 50 MG/ML IJ SOLN
INTRAMUSCULAR | Status: AC
  Administered 2018-01-06: 25 mg via INTRAVENOUS
  Filled 2018-01-06: qty 1

## 2018-01-06 MED ORDER — LIDOCAINE HCL (PF) 1 % IJ SOLN
INTRAMUSCULAR | Status: AC
  Filled 2018-01-06: qty 30

## 2018-01-06 MED ORDER — METHYLPREDNISOLONE SODIUM SUCC 125 MG IJ SOLR
INTRAMUSCULAR | Status: AC
  Administered 2018-01-06: 125 mg via INTRAVENOUS
  Filled 2018-01-06: qty 2

## 2018-01-06 MED ORDER — HYDROMORPHONE HCL 1 MG/ML IJ SOLN: 1.0000 mg | Freq: Once | INTRAMUSCULAR | Status: DC | PRN

## 2018-01-06 MED ORDER — SODIUM CHLORIDE 0.9 % IV SOLN
INTRAVENOUS | Status: DC
  Administered 2018-01-06: 08:00:00 via INTRAVENOUS

## 2018-01-06 MED ORDER — FAMOTIDINE 20 MG PO TABS
ORAL_TABLET | ORAL | Status: AC
  Administered 2018-01-06: 40 mg via ORAL
  Filled 2018-01-06: qty 2

## 2018-01-06 MED ORDER — MIDAZOLAM HCL 2 MG/2ML IJ SOLN
INTRAMUSCULAR | Status: DC | PRN
  Administered 2018-01-06: 2 mg via INTRAVENOUS

## 2018-01-06 MED ORDER — IOPAMIDOL (ISOVUE-300) INJECTION 61%
INTRAVENOUS | Status: DC | PRN
  Administered 2018-01-06: 25 mL via INTRAVENOUS

## 2018-01-06 MED ORDER — FENTANYL CITRATE (PF) 100 MCG/2ML IJ SOLN
INTRAMUSCULAR | Status: AC
  Filled 2018-01-06: qty 2

## 2018-01-06 MED ORDER — HEPARIN (PORCINE) IN NACL 1000-0.9 UT/500ML-% IV SOLN
INTRAVENOUS | Status: AC
  Filled 2018-01-06: qty 1000

## 2018-01-06 MED ORDER — CEFAZOLIN SODIUM-DEXTROSE 1-4 GM/50ML-% IV SOLN
INTRAVENOUS | Status: AC
  Administered 2018-01-06: 1 g via INTRAVENOUS
  Filled 2018-01-06: qty 50

## 2018-01-06 MED ORDER — METHYLPREDNISOLONE SODIUM SUCC 125 MG IJ SOLR
125.0000 mg | Freq: Once | INTRAMUSCULAR | Status: AC
  Administered 2018-01-06: 125 mg via INTRAVENOUS

## 2018-01-06 SURGICAL SUPPLY — 14 items
BALLN LUTONIX AV 8X40X75 (BALLOONS) ×3
BALLOON LUTONIX AV 8X40X75 (BALLOONS) ×1 IMPLANT
CATH BEACON 5 .035 40 KMP TP (CATHETERS) ×1 IMPLANT
CATH BEACON 5 .038 40 KMP TP (CATHETERS) ×2
DEVICE PRESTO INFLATION (MISCELLANEOUS) ×3 IMPLANT
DRAPE BRACHIAL (DRAPES) ×3 IMPLANT
NEEDLE ENTRY 21GA 7CM ECHOTIP (NEEDLE) ×3 IMPLANT
PACK ANGIOGRAPHY (CUSTOM PROCEDURE TRAY) ×3 IMPLANT
SET INTRO CAPELLA COAXIAL (SET/KITS/TRAYS/PACK) ×3 IMPLANT
SHEATH BRITE TIP 6FRX5.5 (SHEATH) ×3 IMPLANT
SHEATH BRITE TIP 7FRX5.5 (SHEATH) ×3 IMPLANT
SUT MNCRL AB 4-0 PS2 18 (SUTURE) ×3 IMPLANT
TOWEL OR 17X26 4PK STRL BLUE (TOWEL DISPOSABLE) ×3 IMPLANT
WIRE MAGIC TOR.035 180C (WIRE) ×3 IMPLANT

## 2018-01-06 NOTE — Discharge Instructions (Signed)

## 2018-01-06 NOTE — H&P (Signed)
[facility] Admission History & Physical  MRN : [id number]  Sally Lynch is a 62 y.o. (11-Jun-1955) female who presents with chief complaint of No chief complaint on file. Marland Kitchen  History of Present Illness: I am asked to evaluate the patient by the dialysis center. The patient was sent here because they were unable to achieve adequate dialysis over the past several weeks. Furthermore the Center states there is very poor thrill and bruit. The patient states that they must turn it way down and run her below normal because when they try to run her at a normal rate her hand becomes excruciatingly painful.. The patient estimates these problems have been going on for several weeks. The patient is unaware of any other change.  Patient denies pain or tenderness overlying the access.  There is no pain with dialysis.  The patient denies hand pain or finger pain consistent with steal syndrome.   There have been multiple past interventions or declots of this access.  The patient is not chronically hypotensive on dialysis.  Current Facility-Administered Medications  Medication Dose Route Frequency Provider Last Rate Last Dose  . 0.9 %  sodium chloride infusion   Intravenous Continuous Kris Hartmann, NP 10 mL/hr at 01/06/18 0805    . ceFAZolin (ANCEF) 1-4 GM/50ML-% IVPB           . ceFAZolin (ANCEF) IVPB 1 g/50 mL premix  1 g Intravenous Once Eulogio Ditch E, NP      . HYDROmorphone (DILAUDID) injection 1 mg  1 mg Intravenous Once PRN Kris Hartmann, NP      . ondansetron (ZOFRAN) injection 4 mg  4 mg Intravenous Q6H PRN Kris Hartmann, NP        Past Medical History:  Diagnosis Date  . Anginal pain ([place])   . Arthritis   . CHF (congestive heart failure) ([place])   . Chronic kidney disease   . Coronary artery disease   . Diabetes mellitus without complication ([place])   . GERD (gastroesophageal reflux disease)   . Hypertension   . Myocardial infarction [facility])     Past  Surgical History:  Procedure Laterality Date  . AV FISTULA PLACEMENT  2011  . CARDIAC CATHETERIZATION    . EYE SURGERY     bilateral cararact surgery  . PERIPHERAL VASCULAR CATHETERIZATION N/A 07/12/2015   Procedure: A/V Shuntogram/Fistulagram;  Surgeon: Katha Cabal, MD;  Location: [place] [facility];  Service: Cardiovascular;  Laterality: N/A;    Social History Social History   Tobacco Use  . Smoking status: Never Smoker  . Smokeless tobacco: Never Used  Substance Use Topics  . Alcohol use: No  . Drug use: No    Family History No family history on file.  No family history of bleeding or clotting disorders, autoimmune disease or porphyria  Allergies  Allergen Reactions  . Shellfish Allergy Hives  . Contrast Media [Iodinated Diagnostic Agents] Hives  . Furosemide Rash  . Other Rash    Raw fruits  . Peanut Oil Rash     REVIEW OF SYSTEMS (Negative unless checked)  Constitutional: [] Weight loss  [] Fever  [] Chills Cardiac: [] Chest pain   [] Chest pressure   [] Palpitations   [] Shortness of breath when laying flat   [] Shortness of breath at rest   [x] Shortness of breath with exertion. Vascular:  [] Pain in legs with walking   [] Pain in legs at rest   [] Pain in legs when laying flat   [] Claudication   []   Pain in feet when walking  [] Pain in feet at rest  [] Pain in feet when laying flat   [] History of DVT   [] Phlebitis   [] Swelling in legs   [] Varicose veins   [] Non-healing ulcers Pulmonary:   [] Uses home oxygen   [] Productive cough   [] Hemoptysis   [] Wheeze  [] COPD   [] Asthma Neurologic:  [] Dizziness  [] Blackouts   [] Seizures   [] History of stroke   [] History of TIA  [] Aphasia   [] Temporary blindness   [] Dysphagia   [] Weakness or numbness in arms   [] Weakness or numbness in legs Musculoskeletal:  [] Arthritis   [] Joint swelling   [] Joint pain   [] Low back pain Hematologic:  [] Easy bruising  [] Easy bleeding   [] Hypercoagulable state   [] Anemic  [] Hepatitis Gastrointestinal:   [] Blood in stool   [] Vomiting blood  [] Gastroesophageal reflux/heartburn   [] Difficulty swallowing. Genitourinary:  [x] Chronic kidney disease   [] Difficult urination  [] Frequent urination  [] Burning with urination   [] Blood in urine Skin:  [] Rashes   [] Ulcers   [] Wounds Psychological:  [] History of anxiety   []  History of major depression.  Physical Examination  Vitals:   01/06/18 0744  BP: (!) 153/78  Pulse: 77  Resp: 18  Temp: 97.9 F (36.6 C)  TempSrc: Oral  SpO2: 99%  Weight: 87 kg  Height: 5\' 4"  (1.626 m)   Body mass index is 32.92 kg/m. Gen: WD/WN, NAD Head: [place]/AT, No temporalis wasting. Prominent temp pulse not noted. Ear/Nose/Throat: Hearing grossly intact, nares w/o erythema or drainage, oropharynx w/o Erythema/Exudate,  Eyes: Conjunctiva clear, sclera non-icteric Neck: Trachea midline.  No JVD.  Pulmonary:  Good air movement, respirations not labored, no use of accessory muscles.  Cardiac: RRR, normal S1, S2. Vascular: Left arm AV access poor thrill poor bruit Vessel Right Left  Radial Palpable  not palpable  Ulnar Not Palpable Not Palpable  Brachial Palpable  week palpable  Carotid Palpable, without bruit Palpable, without bruit  Gastrointestinal: soft, non-tender/non-distended. No guarding/reflex.  Musculoskeletal: M/S 5/5 throughout.  Extremities without ischemic changes.  No deformity or atrophy.  Neurologic: Sensation grossly intact in extremities.  Symmetrical.  Speech is fluent. Motor exam as listed above. Psychiatric: Judgment intact, Mood & affect appropriate for pt's clinical situation. Dermatologic: No rashes or ulcers noted.  No cellulitis or open wounds. Lymph : No Cervical, Axillary, or Inguinal lymphadenopathy.   CBC No results found for: WBC, HGB, HCT, MCV, PLT  BMET    Component Value Date/Time   K 6.3 (HH) 01/24/2017 0515   CrCl cannot be calculated (No successful lab value found.).  COAG No results found for: INR,  PROTIME  Radiology No results found.  Assessment/Plan 1.  Complication dialysis device with thrombosis AV access:  Patient's left arm dialysis access is malfunctioning. The patient will undergo angiography and correction of any problems using interventional techniques with the hope of restoring function to the access.  The risks and benefits were described to the patient.  All questions were answered.  The patient agrees to proceed with angiography and intervention. Potassium will be drawn to ensure that it is an appropriate level prior to performing intervention. 2.  End-stage renal disease requiring hemodialysis:  Patient will continue dialysis therapy without further interruption if a successful intervention is not achieved then a tunneled catheter will be placed. Dialysis has already been arranged. 3.  Hypertension:  Patient will continue medical management; nephrology is following no changes in oral medications. 4. Diabetes mellitus:  Glucose will be monitored  and oral medications been held this morning once the patient has undergone the patient's procedure po intake will be reinitiated and again Accu-Cheks will be used to assess the blood glucose level and treat as needed. The patient will be restarted on the patient's usual hypoglycemic regime 5.  CHF associated with coronary artery disease:  EKG will be monitored. Nitrates will be used if needed. The patient's oral cardiac medications will be continued.    Hortencia Pilar, MD  01/06/2018 8:55 AM

## 2018-01-06 NOTE — Op Note (Signed)
OPERATIVE NOTE   PROCEDURE: 1. Contrast injection left basilic AV access 2. Percutaneous transluminal angioplasty venous outflow left basilic AV access  PRE-OPERATIVE DIAGNOSIS: Complication of dialysis access                                                       End Stage Renal Disease  POST-OPERATIVE DIAGNOSIS: same as above   SURGEON: Katha Cabal, M.D.  ANESTHESIA: Conscious sedation was administered under my direct supervision by the interventional radiology RN. IV Versed plus fentanyl were utilized. Continuous ECG, pulse oximetry and blood pressure was monitored throughout the entire procedure.  Conscious sedation was for a total of 19 minutes.  ESTIMATED BLOOD LOSS: minimal  FINDING(S): Stricture of the AV graft  SPECIMEN(S):  None  CONTRAST: 25 cc  FLUOROSCOPY TIME: 0.6 minutes  INDICATIONS: Sally Lynch is a 62 y.o. female who  presents with malfunctioning left basilic AV access.  The patient is scheduled for angiography with possible intervention of the AV access.  The patient is aware the risks include but are not limited to: bleeding, infection, thrombosis of the cannulated access, and possible anaphylactic reaction to the contrast.  The patient acknowledges if the access can not be salvaged a tunneled catheter will be needed and will be placed during this procedure.  The patient is aware of the risks of the procedure and elects to proceed with the angiogram and intervention.  DESCRIPTION: After full informed written consent was obtained, the patient was brought back to the Special Procedure suite and placed supine position.  Appropriate cardiopulmonary monitors were placed.  The left arm was prepped and draped in the standard fashion.  Appropriate timeout is called. The left basilic transposition was cannulated with a micropuncture needle.  Cannulation was performed with ultrasound guidance. Ultrasound was placed in a sterile sleeve, the AV access was interrogated  and noted to be echolucent and compressible indicating patency. Image was recorded for the permanent record. The puncture is performed under continuous ultrasound visualization.   The microwire was advanced and the needle was exchanged for  a microsheath.  The J-wire was then advanced and a 6 Fr sheath inserted.  Hand injections were completed to image the access from the arterial anastomosis through the entire access.  The central venous structures were also imaged by hand injections.  Based on the images, there is a 80% stenosis at the leading edge of the previously placed stent otherwise the AV fistula and the central veins are widely patent.    3000 units of heparin was given and a wire was negotiated through the strictures within the venous portion of the graft.  The sheath was then upsized to a 7 [place] sheath and an 8 mm x 40 mm Lutonix drug-eluting balloon was used.  Inflation was to 10 atm for 1 minute.  Follow-up imaging demonstrates complete resolution of the stricture with rapid flow of contrast through the graft, the central venous anatomy is preserved.  A 4-0 Monocryl purse-string suture was sewn around the sheath.  The sheath was removed and light pressure was applied.  A sterile bandage was applied to the puncture site.    COMPLICATIONS: None  CONDITION: [name], M.D [facility] Office: [phone]  01/06/2018 9:30 AM

## 2018-01-21 ENCOUNTER — Other Ambulatory Visit
Admission: RE | Admit: 2018-01-21 | Discharge: 2018-01-21 | Disposition: A | Discharge status: Home / Self Care | Payer: Medicare Other | Source: Ambulatory Visit | Attending: Nephrology | Admitting: Nephrology

## 2018-01-21 DIAGNOSIS — E875 Hyperkalemia: Secondary | ICD-10-CM | POA: Insufficient documentation

## 2018-01-21 LAB — POTASSIUM: Potassium: 5.8 mmol/L — ABNORMAL HIGH (ref 3.5–5.1)

## 2018-01-22 ENCOUNTER — Other Ambulatory Visit (INDEPENDENT_AMBULATORY_CARE_PROVIDER_SITE_OTHER): Payer: Self-pay | Admitting: Vascular Surgery

## 2018-01-22 ENCOUNTER — Ambulatory Visit (INDEPENDENT_AMBULATORY_CARE_PROVIDER_SITE_OTHER): Payer: Medicare Other | Admitting: Nurse Practitioner

## 2018-01-22 ENCOUNTER — Ambulatory Visit (INDEPENDENT_AMBULATORY_CARE_PROVIDER_SITE_OTHER): Payer: Medicare Other

## 2018-01-22 ENCOUNTER — Encounter (INDEPENDENT_AMBULATORY_CARE_PROVIDER_SITE_OTHER): Payer: Self-pay | Admitting: Nurse Practitioner

## 2018-01-22 VITALS — BP 134/75 | HR 88 | Resp 16 | Ht 64.0 in | Wt 193.8 lb

## 2018-01-22 DIAGNOSIS — I10 Essential (primary) hypertension: Secondary | ICD-10-CM | POA: Diagnosis not present

## 2018-01-22 DIAGNOSIS — E119 Type 2 diabetes mellitus without complications: Secondary | ICD-10-CM | POA: Diagnosis not present

## 2018-01-22 DIAGNOSIS — N186 End stage renal disease: Secondary | ICD-10-CM

## 2018-01-22 DIAGNOSIS — T829XXS Unspecified complication of cardiac and vascular prosthetic device, implant and graft, sequela: Secondary | ICD-10-CM

## 2018-01-22 DIAGNOSIS — Z794 Long term (current) use of insulin: Secondary | ICD-10-CM

## 2018-01-22 DIAGNOSIS — E782 Mixed hyperlipidemia: Secondary | ICD-10-CM | POA: Diagnosis not present

## 2018-01-27 ENCOUNTER — Encounter (INDEPENDENT_AMBULATORY_CARE_PROVIDER_SITE_OTHER): Payer: Self-pay | Admitting: Nurse Practitioner

## 2018-01-27 NOTE — Progress Notes (Signed)
Subjective:    Patient ID: Sally Lynch, female    DOB: [birth year], 62 y.o.   MRN: [id number] Chief Complaint  Patient presents with  . Follow-up    [facility] 2week HDA    HPI  Sally Lynch is a 62 y.o. female thatt returns to the office for followup of their dialysis access. The function of the access has been stable. The patient denies increased bleeding time or increased recirculation. Patient denies difficulty with cannulation. The patient denies hand pain or other symptoms consistent with steal phenomena.  No significant arm swelling.  The patient denies redness or swelling at the access site. The patient denies fever or chills at home or while on dialysis.  The patient denies amaurosis fugax or recent TIA symptoms. There are no recent neurological changes noted. The patient denies claudication symptoms or rest pain symptoms. The patient denies history of DVT, PE or superficial thrombophlebitis. The patient denies recent episodes of angina or shortness of breath.   The patient underwent a left upper extremity hemodialysis access duplex which revealed a flow volume of 1337.  There is a patent left arm AV fistula.   Past Medical History:  Diagnosis Date  . Anginal pain ([place])   . Arthritis   . CHF (congestive heart failure) ([place])   . Chronic kidney disease   . Coronary artery disease   . Diabetes mellitus without complication ([place])   . GERD (gastroesophageal reflux disease)   . Hypertension   . Myocardial infarction [facility])     Past Surgical History:  Procedure Laterality Date  . A/V FISTULAGRAM Left 01/06/2018   Procedure: A/V FISTULAGRAM;  Surgeon: Katha Cabal, MD;  Location: [place] [facility];  Service: Cardiovascular;  Laterality: Left;  . AV FISTULA PLACEMENT  2011  . CARDIAC CATHETERIZATION    . EYE SURGERY     bilateral cararact surgery  . PERIPHERAL VASCULAR CATHETERIZATION N/A 07/12/2015   Procedure: A/V Shuntogram/Fistulagram;  Surgeon: Katha Cabal, MD;  Location: [place] [facility];  Service: Cardiovascular;  Laterality: N/A;    Social History   Socioeconomic History  . Marital status: Widowed    Spouse name: Not on file  . Number of children: Not on file  . Years of education: Not on file  . Highest education level: Not on file  Occupational History  . Not on file  Social Needs  . Financial resource strain: Not on file  . Food insecurity:    Worry: Not on file    Inability: Not on file  . Transportation needs:    Medical: Not on file    Non-medical: Not on file  Tobacco Use  . Smoking status: Never Smoker  . Smokeless tobacco: Never Used  Substance and Sexual Activity  . Alcohol use: No  . Drug use: No  . Sexual activity: Never    Birth control/protection: Post-menopausal  Lifestyle  . Physical activity:    Days per week: Not on file    Minutes per session: Not on file  . Stress: Not on file  Relationships  . Social connections:    Talks on phone: Not on file    Gets together: Not on file    Attends religious service: Not on file    Active member of club or organization: Not on file    Attends meetings of clubs or organizations: Not on file    Relationship status: Not on file  . Intimate partner violence:    Fear  of current or ex partner: No    Emotionally abused: No    Physically abused: No    Forced sexual activity: No  Other Topics Concern  . Not on file  Social History Narrative  . Not on file    History reviewed. No pertinent family history.  Allergies  Allergen Reactions  . Shellfish Allergy Hives  . Contrast Media [Iodinated Diagnostic Agents] Hives  . Furosemide Rash  . Other Rash    Raw fruits  . Peanut Oil Rash     Review of Systems   Review of Systems: Negative Unless Checked Constitutional: [] Weight loss  [] Fever  [] Chills Cardiac: [] Chest pain   []  Atrial Fibrillation  [] Palpitations   [] Shortness of breath when laying flat   [] Shortness of breath with  exertion. Vascular:  [] Pain in legs with walking   [] Pain in legs with standing  [] History of DVT   [] Phlebitis   [] Swelling in legs   [] Varicose veins   [] Non-healing ulcers Pulmonary:   [] Uses home oxygen   [] Productive cough   [] Hemoptysis   [] Wheeze  [] COPD   [] Asthma Neurologic:  [] Dizziness   [] Seizures   [] History of stroke   [] History of TIA  [] Aphasia   [] Vissual changes   [] Weakness or numbness in arm   [] Weakness or numbness in leg Musculoskeletal:   [] Joint swelling   [x] Joint pain   [] Low back pain  []  History of Knee Replacement Hematologic:  [] Easy bruising  [] Easy bleeding   [] Hypercoagulable state   [] Anemic Gastrointestinal:  [] Diarrhea   [] Vomiting  [x] Gastroesophageal reflux/heartburn   [] Difficulty swallowing. Genitourinary:  [] Chronic kidney disease   [] Difficult urination  [] Anuric   [] Blood in urine Skin:  [] Rashes   [] Ulcers  Psychological:  [] History of anxiety   []  History of major depression  []  Memory Difficulties     Objective:   Physical Exam  BP 134/75 (BP Location: Right Arm)   Pulse 88   Resp 16   Ht 5\' 4"  (1.626 m)   Wt 193 lb 12.8 oz (87.9 kg)   BMI 33.27 kg/m   Gen: WD/WN, NAD Head: [place]/AT, No temporalis wasting.  Ear/Nose/Throat: Hearing grossly intact, nares w/o erythema or drainage Eyes: PER, EOMI, sclera nonicteric.  Neck: Supple, no masses.  No JVD.  Pulmonary:  Good air movement, no use of accessory muscles.  Cardiac: RRR Vascular: good thrill and bruit Vessel Right Left  Radial Palpable Palpable   Gastrointestinal: soft, non-distended. No guarding/no peritoneal signs.  Musculoskeletal: M/S 5/5 throughout.  No deformity or atrophy.  Neurologic: Pain and light touch intact in extremities.  Symmetrical.  Speech is fluent. Motor exam as listed above. Psychiatric: Judgment intact, Mood & affect appropriate for pt's clinical situation. Dermatologic: No Venous rashes. No Ulcers Noted.  No changes consistent with cellulitis. Lymph : No  Cervical lymphadenopathy, no lichenification or skin changes of chronic lymphedema.      Assessment & Plan:   1. Complication of vascular access for dialysis, sequela  Recommend:  The patient is doing well and currently has adequate dialysis access. The patient's dialysis center is not reporting any access issues. Flow pattern is stable when compared to the prior ultrasound.  The patient should have a duplex ultrasound of the dialysis access in 6 months. The patient will follow-up with me in the office after each ultrasound    - VAS [place] (AVF, AVG); Future  2. Mixed hyperlipidemia Continue statin as ordered and reviewed, no changes at this time  3. Essential hypertension Continue antihypertensive medications as already ordered, these medications have been reviewed and there are no changes at this time.   4. Type 2 diabetes mellitus treated with insulin ([place]) Continue hypoglycemic medications as already ordered, these medications have been reviewed and there are no changes at this time.  Hgb A1C to be monitored as already arranged by primary service    Current Outpatient Medications on File Prior to Visit  Medication Sig Dispense Refill  . acetaminophen (TYLENOL) 325 MG tablet Take 650 mg by mouth every 4 (four) hours as needed for moderate pain.    Marland Kitchen aspirin 81 MG tablet Take 81 mg by mouth daily.    Marland Kitchen atorvastatin (LIPITOR) 20 MG tablet Take 80 mg by mouth daily.     . calcitRIOL (ROCALTROL) 0.25 MCG capsule Take by mouth.    . cholecalciferol (VITAMIN D) 1000 units tablet Take 1,000 Units by mouth daily.    . cinacalcet (SENSIPAR) 90 MG tablet Take 90 mg by mouth daily.    . clopidogrel (PLAVIX) 75 MG tablet Take 75 mg by mouth daily.    . fluticasone (FLONASE) 50 MCG/ACT nasal spray Place into both nostrils 2 (two) times daily as needed.     . gabapentin (NEURONTIN) 300 MG capsule Take 300 mg by mouth daily.    . hydrOXYzine (ATARAX/VISTARIL) 25  MG tablet Take 25 mg by mouth 3 (three) times daily as needed.    . insulin glargine (LANTUS) 100 UNIT/ML injection Inject 20 Units into the skin at bedtime.    . lidocaine-prilocaine (EMLA) cream Apply topically.    Marland Kitchen losartan (COZAAR) 25 MG tablet Take 25 mg by mouth daily.    . metoprolol succinate (TOPROL-XL) 25 MG 24 hr tablet Take 12.5 mg by mouth 3 (three) times daily.     . midodrine (PROAMATINE) 5 MG tablet Take 1 tab 1/2 hr before dialysis, take 1 tab mid treatment prn hypotension    . nitroGLYCERIN (NITROSTAT) 0.4 MG SL tablet Place 0.4 mg under the tongue every 5 (five) minutes as needed for chest pain.    . pantoprazole (PROTONIX) 40 MG tablet Take 40 mg by mouth daily.    . sevelamer carbonate (RENVELA) 800 MG tablet Take 2 tabs for 3 meals per day and 1 tabs for 2 snacks per day    . triamcinolone cream (KENALOG) 0.1 % Mix 1:1 w Eucerin cream.  Apply to dry itchy skin twice daily prn     No current facility-administered medications on file prior to visit.     There are no Patient Instructions on file for this visit. Return in about 6 months (around 07/23/2018).   Kris Hartmann, NP  This note was completed with Sales executive.  Any errors are purely unintentional.

## 2018-07-23 ENCOUNTER — Other Ambulatory Visit: Payer: Self-pay

## 2018-07-23 ENCOUNTER — Ambulatory Visit (INDEPENDENT_AMBULATORY_CARE_PROVIDER_SITE_OTHER): Payer: Medicare Other | Admitting: Nurse Practitioner

## 2018-07-23 ENCOUNTER — Encounter (INDEPENDENT_AMBULATORY_CARE_PROVIDER_SITE_OTHER): Payer: Self-pay | Admitting: Nurse Practitioner

## 2018-07-23 ENCOUNTER — Ambulatory Visit (INDEPENDENT_AMBULATORY_CARE_PROVIDER_SITE_OTHER): Payer: Medicare Other

## 2018-07-23 VITALS — BP 123/71 | HR 70 | Resp 10 | Ht 64.0 in | Wt 194.0 lb

## 2018-07-23 DIAGNOSIS — Z992 Dependence on renal dialysis: Secondary | ICD-10-CM | POA: Diagnosis not present

## 2018-07-23 DIAGNOSIS — Z79899 Other long term (current) drug therapy: Secondary | ICD-10-CM

## 2018-07-23 DIAGNOSIS — T829XXS Unspecified complication of cardiac and vascular prosthetic device, implant and graft, sequela: Secondary | ICD-10-CM

## 2018-07-23 DIAGNOSIS — Z794 Long term (current) use of insulin: Secondary | ICD-10-CM | POA: Diagnosis not present

## 2018-07-23 DIAGNOSIS — E119 Type 2 diabetes mellitus without complications: Secondary | ICD-10-CM

## 2018-07-23 DIAGNOSIS — T829XXD Unspecified complication of cardiac and vascular prosthetic device, implant and graft, subsequent encounter: Secondary | ICD-10-CM

## 2018-07-23 DIAGNOSIS — E782 Mixed hyperlipidemia: Secondary | ICD-10-CM

## 2018-07-26 ENCOUNTER — Encounter (INDEPENDENT_AMBULATORY_CARE_PROVIDER_SITE_OTHER): Payer: Self-pay | Admitting: Nurse Practitioner

## 2018-07-26 NOTE — Progress Notes (Signed)
SUBJECTIVE:  Patient ID: Sally Lynch, female    DOB: [birth year], 63 y.o.   MRN: [id number] Chief Complaint  Patient presents with  . Follow-up    HPI  Sally Lynch is a 63 y.o. female The patient returns to the office for followup of their dialysis access. The function of the access has been stable. The patient denies increased bleeding time or increased recirculation. Patient denies difficulty with cannulation. The patient denies hand pain or other symptoms consistent with steal phenomena.  No significant arm swelling.  The patient denies redness or swelling at the access site. The patient denies fever or chills at home or while on dialysis.  The patient denies amaurosis fugax or recent TIA symptoms. There are no recent neurological changes noted. The patient denies claudication symptoms or rest pain symptoms. The patient denies history of DVT, PE or superficial thrombophlebitis. The patient denies recent episodes of angina or shortness of breath.   Patient underwent hemodialysis duplex today which revealed a flow volume of 1667.  Patent left brachiobasilic AV fistula.     Past Medical History:  Diagnosis Date  . Anginal pain ([place])   . Arthritis   . CHF (congestive heart failure) ([place])   . Chronic kidney disease   . Coronary artery disease   . Diabetes mellitus without complication ([place])   . GERD (gastroesophageal reflux disease)   . Hypertension   . Myocardial infarction [facility])     Past Surgical History:  Procedure Laterality Date  . A/V FISTULAGRAM Left 01/06/2018   Procedure: A/V FISTULAGRAM;  Surgeon: Katha Cabal, MD;  Location: [place] [facility];  Service: Cardiovascular;  Laterality: Left;  . AV FISTULA PLACEMENT  2011  . CARDIAC CATHETERIZATION    . EYE SURGERY     bilateral cararact surgery  . PERIPHERAL VASCULAR CATHETERIZATION N/A 07/12/2015   Procedure: A/V Shuntogram/Fistulagram;  Surgeon: Katha Cabal, MD;  Location: [place] [facility];  Service: Cardiovascular;  Laterality: N/A;    Social History   Socioeconomic History  . Marital status: Widowed    Spouse name: Not on file  . Number of children: Not on file  . Years of education: Not on file  . Highest education level: Not on file  Occupational History  . Not on file  Social Needs  . Financial resource strain: Not on file  . Food insecurity:    Worry: Not on file    Inability: Not on file  . Transportation needs:    Medical: Not on file    Non-medical: Not on file  Tobacco Use  . Smoking status: Never Smoker  . Smokeless tobacco: Never Used  Substance and Sexual Activity  . Alcohol use: No  . Drug use: No  . Sexual activity: Never    Birth control/protection: Post-menopausal  Lifestyle  . Physical activity:    Days per week: Not on file    Minutes per session: Not on file  . Stress: Not on file  Relationships  . Social connections:    Talks on phone: Not on file    Gets together: Not on file    Attends religious service: Not on file    Active member of club or organization: Not on file    Attends meetings of clubs or organizations: Not on file    Relationship status: Not on file  . Intimate partner violence:    Fear of current or ex partner: No    Emotionally abused: No  Physically abused: No    Forced sexual activity: No  Other Topics Concern  . Not on file  Social History Narrative  . Not on file    History reviewed. No pertinent family history.  Allergies  Allergen Reactions  . Shellfish Allergy Hives  . Contrast Media [Iodinated Diagnostic Agents] Hives  . Furosemide Rash  . Other Rash    Raw fruits  . Peanut Oil Rash     Review of Systems   Review of Systems: Negative Unless Checked Constitutional: [] Weight loss  [] Fever  [] Chills Cardiac: [] Chest pain   []  Atrial Fibrillation  [] Palpitations   [] Shortness of breath when laying flat   [] Shortness of breath with exertion. [] Shortness of breath at rest Vascular:   [] Pain in legs with walking   [] Pain in legs with standing [] Pain in legs when laying flat   [] Claudication    [] Pain in feet when laying flat    [] History of DVT   [] Phlebitis   [] Swelling in legs   [] Varicose veins   [] Non-healing ulcers Pulmonary:   [] Uses home oxygen   [] Productive cough   [] Hemoptysis   [] Wheeze  [] COPD   [] Asthma Neurologic:  [] Dizziness   [] Seizures  [] Blackouts [] History of stroke   [] History of TIA  [] Aphasia   [] Temporary Blindness   [] Weakness or numbness in arm   [] Weakness or numbness in leg Musculoskeletal:   [] Joint swelling   [] Joint pain   [] Low back pain  []  History of Knee Replacement [] Arthritis [] back Surgeries  []  Spinal Stenosis    Hematologic:  [] Easy bruising  [] Easy bleeding   [] Hypercoagulable state   [x] Anemic Gastrointestinal:  [] Diarrhea   [] Vomiting  [] Gastroesophageal reflux/heartburn   [] Difficulty swallowing. [] Abdominal pain Genitourinary:  [x] Chronic kidney disease   [] Difficult urination  [] Anuric   [] Blood in urine [] Frequent urination  [] Burning with urination   [] Hematuria Skin:  [] Rashes   [] Ulcers [] Wounds Psychological:  [] History of anxiety   []  History of major depression  []  Memory Difficulties      OBJECTIVE:   Physical Exam  BP 123/71 (BP Location: Right Arm, Patient Position: Sitting, Cuff Size: Small)   Pulse 70   Resp 10   Ht 5\' 4"  (1.626 m)   Wt 194 lb (88 kg)   BMI 33.30 kg/m   Gen: WD/WN, NAD Head: [place]/AT, No temporalis wasting.  Ear/Nose/Throat: Hearing grossly intact, nares w/o erythema or drainage Eyes: PER, EOMI, sclera nonicteric.  Neck: Supple, no masses.  No JVD.  Pulmonary:  Good air movement, no use of accessory muscles.  Cardiac: RRR Vascular: good thrill and bruit Vessel Right Left  Radial Palpable Palpable   Gastrointestinal: soft, non-distended. No guarding/no peritoneal signs.  Musculoskeletal: M/S 5/5 throughout.  No deformity or atrophy.  Neurologic: Pain and light touch intact in extremities.   Symmetrical.  Speech is fluent. Motor exam as listed above. Psychiatric: Judgment intact, Mood & affect appropriate for pt's clinical situation. Dermatologic: No Venous rashes. No Ulcers Noted.  No changes consistent with cellulitis. Lymph : No Cervical lymphadenopathy, no lichenification or skin changes of chronic lymphedema.       ASSESSMENT AND PLAN:  1. Complication of vascular access for dialysis, sequela Recommend:  The patient is doing well and currently has adequate dialysis access. The patient's dialysis center is not reporting any access issues. Flow pattern is stable when compared to the prior ultrasound.  The patient should have a duplex ultrasound of the dialysis access in 6 months. The patient will follow-up  with me in the office after each ultrasound    - VAS [place] (AVF, AVG); Future  2. Mixed hyperlipidemia Continue statin as ordered and reviewed, no changes at this time   3. Type 2 diabetes mellitus treated with insulin ([place]) Continue hypoglycemic medications as already ordered, these medications have been reviewed and there are no changes at this time.  Hgb A1C to be monitored as already arranged by primary service    Current Outpatient Medications on File Prior to Visit  Medication Sig Dispense Refill  . acetaminophen (TYLENOL) 325 MG tablet Take 650 mg by mouth every 4 (four) hours as needed for moderate pain.    Marland Kitchen aspirin 81 MG tablet Take 81 mg by mouth daily.    Marland Kitchen atorvastatin (LIPITOR) 20 MG tablet Take 80 mg by mouth daily.     . calcitRIOL (ROCALTROL) 0.25 MCG capsule Take by mouth.    . cholecalciferol (VITAMIN D) 1000 units tablet Take 1,000 Units by mouth daily.    . cinacalcet (SENSIPAR) 90 MG tablet Take 90 mg by mouth daily.    . clopidogrel (PLAVIX) 75 MG tablet Take 75 mg by mouth daily.    . fluticasone (FLONASE) 50 MCG/ACT nasal spray Place into both nostrils 2 (two) times daily as needed.     . gabapentin (NEURONTIN)  300 MG capsule Take 300 mg by mouth daily.    . hydrOXYzine (ATARAX/VISTARIL) 25 MG tablet Take 25 mg by mouth 3 (three) times daily as needed.    . insulin glargine (LANTUS) 100 UNIT/ML injection Inject 20 Units into the skin at bedtime.    . lidocaine-prilocaine (EMLA) cream Apply topically.    . metoprolol succinate (TOPROL-XL) 25 MG 24 hr tablet Take 25 mg by mouth daily.     . midodrine (PROAMATINE) 5 MG tablet Take 1 tab 1/2 hr before dialysis, take 1 tab mid treatment prn hypotension    . nitroGLYCERIN (NITROSTAT) 0.4 MG SL tablet Place 0.4 mg under the tongue every 5 (five) minutes as needed for chest pain.    . pantoprazole (PROTONIX) 40 MG tablet Take 40 mg by mouth daily.    . sevelamer carbonate (RENVELA) 800 MG tablet Take 2 tabs for 3 meals per day and 1 tabs for 2 snacks per day    . triamcinolone cream (KENALOG) 0.1 % Mix 1:1 w Eucerin cream.  Apply to dry itchy skin twice daily prn    . losartan (COZAAR) 25 MG tablet Take 25 mg by mouth daily.     No current facility-administered medications on file prior to visit.     There are no Patient Instructions on file for this visit. Return in about 6 months (around 01/23/2019).   Kris Hartmann, NP  This note was completed with Sales executive.  Any errors are purely unintentional.

## 2019-01-28 ENCOUNTER — Encounter (INDEPENDENT_AMBULATORY_CARE_PROVIDER_SITE_OTHER): Payer: Medicare Other

## 2019-01-28 ENCOUNTER — Ambulatory Visit (INDEPENDENT_AMBULATORY_CARE_PROVIDER_SITE_OTHER): Payer: Medicare Other | Admitting: Vascular Surgery

## 2019-03-04 ENCOUNTER — Encounter (INDEPENDENT_AMBULATORY_CARE_PROVIDER_SITE_OTHER): Payer: Self-pay | Admitting: Nurse Practitioner

## 2019-03-04 ENCOUNTER — Ambulatory Visit (INDEPENDENT_AMBULATORY_CARE_PROVIDER_SITE_OTHER): Payer: Medicare Other | Admitting: Nurse Practitioner

## 2019-03-04 ENCOUNTER — Other Ambulatory Visit: Payer: Self-pay

## 2019-03-04 ENCOUNTER — Ambulatory Visit (INDEPENDENT_AMBULATORY_CARE_PROVIDER_SITE_OTHER): Payer: Medicare Other

## 2019-03-04 ENCOUNTER — Encounter (INDEPENDENT_AMBULATORY_CARE_PROVIDER_SITE_OTHER): Payer: Self-pay

## 2019-03-04 ENCOUNTER — Other Ambulatory Visit (INDEPENDENT_AMBULATORY_CARE_PROVIDER_SITE_OTHER): Payer: Self-pay | Admitting: Vascular Surgery

## 2019-03-04 VITALS — BP 128/65 | HR 80 | Resp 16 | Wt 193.0 lb

## 2019-03-04 DIAGNOSIS — I2510 Atherosclerotic heart disease of native coronary artery without angina pectoris: Secondary | ICD-10-CM | POA: Insufficient documentation

## 2019-03-04 DIAGNOSIS — Z794 Long term (current) use of insulin: Secondary | ICD-10-CM

## 2019-03-04 DIAGNOSIS — N186 End stage renal disease: Secondary | ICD-10-CM

## 2019-03-04 DIAGNOSIS — N189 Chronic kidney disease, unspecified: Secondary | ICD-10-CM | POA: Insufficient documentation

## 2019-03-04 DIAGNOSIS — E119 Type 2 diabetes mellitus without complications: Secondary | ICD-10-CM

## 2019-03-04 DIAGNOSIS — I10 Essential (primary) hypertension: Secondary | ICD-10-CM | POA: Diagnosis not present

## 2019-03-08 ENCOUNTER — Encounter (INDEPENDENT_AMBULATORY_CARE_PROVIDER_SITE_OTHER): Payer: Self-pay | Admitting: Nurse Practitioner

## 2019-03-08 NOTE — Progress Notes (Signed)
SUBJECTIVE:  Patient ID: Sally Lynch, female    DOB: September 09, 1955, 63 y.o.   MRN: [id number] Chief Complaint  Patient presents with  . Follow-up    ultrasound follow up    HPI  Sally Lynch is a 63 y.o. female The patient returns to the office for followup of their dialysis access. The function of the access has been stable. The patient denies increased bleeding time or increased recirculation. Patient denies difficulty with cannulation. The patient denies hand pain or other symptoms consistent with steal phenomena.  No significant arm swelling.  Patient had a recent infiltration however she attributes this mostly due to having some new dialysis technicians.  Bruising is mostly resolved  The patient denies redness or swelling at the access site. The patient denies fever or chills at home or while on dialysis.  The patient denies amaurosis fugax or recent TIA symptoms. There are no recent neurological changes noted. The patient denies claudication symptoms or rest pain symptoms. The patient denies history of DVT, PE or superficial thrombophlebitis. The patient denies recent episodes of angina or shortness of breath.    Today patient had a flow volume of 3279.  The left brachial basilic AV fistula was patent throughout with no issues.  This is compared to previous exam on 07/23/2018.     Past Medical History:  Diagnosis Date  . Anginal pain ([place])   . Arthritis   . CHF (congestive heart failure) ([place])   . Chronic kidney disease   . Coronary artery disease   . Diabetes mellitus without complication ([place])   . GERD (gastroesophageal reflux disease)   . Hypertension   . Myocardial infarction [facility])     Past Surgical History:  Procedure Laterality Date  . A/V FISTULAGRAM Left 01/06/2018   Procedure: A/V FISTULAGRAM;  Surgeon: Katha Cabal, MD;  Location: [place] [facility];  Service: Cardiovascular;  Laterality: Left;  . AV FISTULA PLACEMENT  2011  . CARDIAC  CATHETERIZATION    . EYE SURGERY     bilateral cararact surgery  . PERIPHERAL VASCULAR CATHETERIZATION N/A 07/12/2015   Procedure: A/V Shuntogram/Fistulagram;  Surgeon: Katha Cabal, MD;  Location: [place] [facility];  Service: Cardiovascular;  Laterality: N/A;    Social History   Socioeconomic History  . Marital status: Widowed    Spouse name: Not on file  . Number of children: Not on file  . Years of education: Not on file  . Highest education level: Not on file  Occupational History  . Not on file  Tobacco Use  . Smoking status: Never Smoker  . Smokeless tobacco: Never Used  Substance and Sexual Activity  . Alcohol use: No  . Drug use: No  . Sexual activity: Never    Birth control/protection: Post-menopausal  Other Topics Concern  . Not on file  Social History Narrative  . Not on file   Social Determinants of Health   Financial Resource Strain:   . Difficulty of Paying Living Expenses: Not on file  Food Insecurity:   . Worried About Charity fundraiser in the Last Year: Not on file  . Ran Out of Food in the Last Year: Not on file  Transportation Needs:   . Lack of Transportation (Medical): Not on file  . Lack of Transportation (Non-Medical): Not on file  Physical Activity:   . Days of Exercise per Week: Not on file  . Minutes of Exercise per Session: Not on file  Stress:   .  Feeling of Stress : Not on file  Social Connections:   . Frequency of Communication with Friends and Family: Not on file  . Frequency of Social Gatherings with Friends and Family: Not on file  . Attends Religious Services: Not on file  . Active Member of Clubs or Organizations: Not on file  . Attends Archivist Meetings: Not on file  . Marital Status: Not on file  Intimate Partner Violence:   . Fear of Current or Ex-Partner: Not on file  . Emotionally Abused: Not on file  . Physically Abused: Not on file  . Sexually Abused: Not on file    Family History  Problem  Relation Age of Onset  . Hypertension Mother   . Diabetes Mother   . Diabetes Father   . Heart attack Father     Allergies  Allergen Reactions  . Shellfish Allergy Hives  . Contrast Media [Iodinated Diagnostic Agents] Hives  . Furosemide Rash  . Other Rash    Raw fruits  . Peanut Oil Rash     Review of Systems   Review of Systems: Negative Unless Checked Constitutional: [] Weight loss  [] Fever  [] Chills Cardiac: [] Chest pain   []  Atrial Fibrillation  [] Palpitations   [] Shortness of breath when laying flat   [] Shortness of breath with exertion. [] Shortness of breath at rest Vascular:  [] Pain in legs with walking   [] Pain in legs with standing [] Pain in legs when laying flat   [] Claudication    [] Pain in feet when laying flat    [] History of DVT   [] Phlebitis   [] Swelling in legs   [] Varicose veins   [] Non-healing ulcers Pulmonary:   [] Uses home oxygen   [] Productive cough   [] Hemoptysis   [] Wheeze  [] COPD   [] Asthma Neurologic:  [] Dizziness   [] Seizures  [] Blackouts [] History of stroke   [] History of TIA  [] Aphasia   [] Temporary Blindness   [] Weakness or numbness in arm   [] Weakness or numbness in leg Musculoskeletal:   [] Joint swelling   [] Joint pain   [] Low back pain  []  History of Knee Replacement [] Arthritis [] back Surgeries  []  Spinal Stenosis    Hematologic:  [] Easy bruising  [] Easy bleeding   [] Hypercoagulable state   [x] Anemic Gastrointestinal:  [] Diarrhea   [] Vomiting  [x] Gastroesophageal reflux/heartburn   [] Difficulty swallowing. [] Abdominal pain Genitourinary:  [x] Chronic kidney disease   [] Difficult urination  [] Anuric   [] Blood in urine [] Frequent urination  [] Burning with urination   [] Hematuria Skin:  [] Rashes   [] Ulcers [] Wounds Psychological:  [] History of anxiety   []  History of major depression  []  Memory Difficulties      OBJECTIVE:   Physical Exam  BP 128/65 (BP Location: Right Arm)   Pulse 80   Resp 16   Wt 193 lb (87.5 kg)   BMI 33.13 kg/m   Gen:  WD/WN, NAD Head: [place]/AT, No temporalis wasting.  Ear/Nose/Throat: Hearing grossly intact, nares w/o erythema or drainage Eyes: PER, EOMI, sclera nonicteric.  Neck: Supple, no masses.  No JVD.  Pulmonary:  Good air movement, no use of accessory muscles.  Cardiac: RRR Vascular:  Good thrill and bruit slight bruising Vessel Right Left  Radial Palpable Palpable   Gastrointestinal: soft, non-distended. No guarding/no peritoneal signs.  Musculoskeletal: M/S 5/5 throughout.  No deformity or atrophy.  Neurologic: Pain and light touch intact in extremities.  Symmetrical.  Speech is fluent. Motor exam as listed above. Psychiatric: Judgment intact, Mood & affect appropriate for pt's clinical situation. Dermatologic:  No Venous rashes. No Ulcers Noted.  No changes consistent with cellulitis. Lymph : No Cervical lymphadenopathy, no lichenification or skin changes of chronic lymphedema.       ASSESSMENT AND PLAN:  1. ESRD (end stage renal disease) ([place]) Recommend:  The patient is doing well and currently has adequate dialysis access. The patient's dialysis center is not reporting any access issues. Flow pattern is stable when compared to the prior ultrasound.  The patient should have a duplex ultrasound of the dialysis access in 6 months. The patient will follow-up with me in the office after each ultrasound    - VAS [place] (AVF,AVG); Future  2. Type 2 diabetes mellitus treated with insulin ([place]) Continue hypoglycemic medications as already ordered, these medications have been reviewed and there are no changes at this time.  Hgb A1C to be monitored as already arranged by primary service   3. Essential hypertension Continue antihypertensive medications as already ordered, these medications have been reviewed and there are no changes at this time.    Current Outpatient Medications on File Prior to Visit  Medication Sig Dispense Refill  . acetaminophen (TYLENOL) 325 MG  tablet Take 650 mg by mouth every 4 (four) hours as needed for moderate pain.    Marland Kitchen aspirin 81 MG tablet Take 81 mg by mouth daily.    Marland Kitchen atorvastatin (LIPITOR) 20 MG tablet Take 80 mg by mouth daily.     . calcitRIOL (ROCALTROL) 0.25 MCG capsule Take by mouth.    . cholecalciferol (VITAMIN D) 1000 units tablet Take 1,000 Units by mouth daily.    . cinacalcet (SENSIPAR) 90 MG tablet Take 90 mg by mouth daily.    . clopidogrel (PLAVIX) 75 MG tablet Take 75 mg by mouth daily.    . fluticasone (FLONASE) 50 MCG/ACT nasal spray Place into both nostrils 2 (two) times daily as needed.     . gabapentin (NEURONTIN) 300 MG capsule Take 300 mg by mouth daily.    . hydrOXYzine (ATARAX/VISTARIL) 25 MG tablet Take 25 mg by mouth 3 (three) times daily as needed.    . insulin glargine (LANTUS) 100 UNIT/ML injection Inject 20 Units into the skin at bedtime.    . lidocaine-prilocaine (EMLA) cream Apply topically.    Marland Kitchen losartan (COZAAR) 25 MG tablet Take 25 mg by mouth daily.    . metoprolol succinate (TOPROL-XL) 25 MG 24 hr tablet Take 25 mg by mouth daily.     . midodrine (PROAMATINE) 5 MG tablet Take 1 tab 1/2 hr before dialysis, take 1 tab mid treatment prn hypotension    . nitroGLYCERIN (NITROSTAT) 0.4 MG SL tablet Place 0.4 mg under the tongue every 5 (five) minutes as needed for chest pain.    . pantoprazole (PROTONIX) 40 MG tablet Take 40 mg by mouth daily.    . sevelamer carbonate (RENVELA) 800 MG tablet     . triamcinolone cream (KENALOG) 0.1 % Mix 1:1 w Eucerin cream.  Apply to dry itchy skin twice daily prn     No current facility-administered medications on file prior to visit.    There are no Patient Instructions on file for this visit. No follow-ups on file.   Kris Hartmann, NP  This note was completed with Sales executive.  Any errors are purely unintentional.

## 2019-05-12 ENCOUNTER — Other Ambulatory Visit
Admission: RE | Admit: 2019-05-12 | Discharge: 2019-05-12 | Disposition: A | Discharge status: Home / Self Care | Payer: Medicare Other | Source: Skilled Nursing Facility | Attending: Nephrology | Admitting: Nephrology

## 2019-05-12 DIAGNOSIS — E875 Hyperkalemia: Secondary | ICD-10-CM | POA: Insufficient documentation

## 2019-05-12 LAB — POTASSIUM: Potassium: 3.5 mmol/L (ref 3.5–5.1)

## 2019-06-14 ENCOUNTER — Other Ambulatory Visit: Payer: Self-pay

## 2019-06-14 ENCOUNTER — Emergency Department: Payer: Medicare Other

## 2019-06-14 ENCOUNTER — Observation Stay
Admission: EM | Admit: 2019-06-14 | Discharge: 2019-06-14 | Discharge status: Against Medical Advice | Payer: Medicare Other | Attending: Internal Medicine | Admitting: Internal Medicine

## 2019-06-14 DIAGNOSIS — I132 Hypertensive heart and chronic kidney disease with heart failure and with stage 5 chronic kidney disease, or end stage renal disease: Secondary | ICD-10-CM | POA: Diagnosis not present

## 2019-06-14 DIAGNOSIS — I2510 Atherosclerotic heart disease of native coronary artery without angina pectoris: Secondary | ICD-10-CM | POA: Diagnosis present

## 2019-06-14 DIAGNOSIS — Z7982 Long term (current) use of aspirin: Secondary | ICD-10-CM | POA: Diagnosis not present

## 2019-06-14 DIAGNOSIS — Z7902 Long term (current) use of antithrombotics/antiplatelets: Secondary | ICD-10-CM | POA: Diagnosis not present

## 2019-06-14 DIAGNOSIS — E875 Hyperkalemia: Secondary | ICD-10-CM | POA: Diagnosis present

## 2019-06-14 DIAGNOSIS — Z8249 Family history of ischemic heart disease and other diseases of the circulatory system: Secondary | ICD-10-CM | POA: Diagnosis not present

## 2019-06-14 DIAGNOSIS — E1129 Type 2 diabetes mellitus with other diabetic kidney complication: Secondary | ICD-10-CM | POA: Diagnosis present

## 2019-06-14 DIAGNOSIS — Z794 Long term (current) use of insulin: Secondary | ICD-10-CM | POA: Diagnosis not present

## 2019-06-14 DIAGNOSIS — E1122 Type 2 diabetes mellitus with diabetic chronic kidney disease: Secondary | ICD-10-CM | POA: Diagnosis not present

## 2019-06-14 DIAGNOSIS — I252 Old myocardial infarction: Secondary | ICD-10-CM | POA: Diagnosis not present

## 2019-06-14 DIAGNOSIS — K219 Gastro-esophageal reflux disease without esophagitis: Secondary | ICD-10-CM | POA: Diagnosis present

## 2019-06-14 DIAGNOSIS — I6782 Cerebral ischemia: Secondary | ICD-10-CM | POA: Insufficient documentation

## 2019-06-14 DIAGNOSIS — I10 Essential (primary) hypertension: Secondary | ICD-10-CM | POA: Diagnosis present

## 2019-06-14 DIAGNOSIS — Z992 Dependence on renal dialysis: Secondary | ICD-10-CM | POA: Diagnosis not present

## 2019-06-14 DIAGNOSIS — I509 Heart failure, unspecified: Secondary | ICD-10-CM | POA: Insufficient documentation

## 2019-06-14 DIAGNOSIS — Z79899 Other long term (current) drug therapy: Secondary | ICD-10-CM | POA: Diagnosis not present

## 2019-06-14 DIAGNOSIS — R531 Weakness: Secondary | ICD-10-CM | POA: Insufficient documentation

## 2019-06-14 DIAGNOSIS — M1990 Unspecified osteoarthritis, unspecified site: Secondary | ICD-10-CM | POA: Diagnosis not present

## 2019-06-14 DIAGNOSIS — N186 End stage renal disease: Secondary | ICD-10-CM

## 2019-06-14 DIAGNOSIS — E785 Hyperlipidemia, unspecified: Secondary | ICD-10-CM | POA: Diagnosis not present

## 2019-06-14 DIAGNOSIS — R42 Dizziness and giddiness: Secondary | ICD-10-CM

## 2019-06-14 DIAGNOSIS — Z20822 Contact with and (suspected) exposure to covid-19: Secondary | ICD-10-CM | POA: Diagnosis not present

## 2019-06-14 LAB — RESPIRATORY PANEL BY RT PCR (FLU A&B, COVID)
Influenza A by PCR: NEGATIVE
Influenza B by PCR: NEGATIVE
SARS Coronavirus 2 by RT PCR: NEGATIVE

## 2019-06-14 LAB — HIV ANTIBODY (ROUTINE TESTING W REFLEX): HIV Screen 4th Generation wRfx: NONREACTIVE

## 2019-06-14 LAB — BASIC METABOLIC PANEL
Anion gap: 16 — ABNORMAL HIGH (ref 5–15)
BUN: 61 mg/dL — ABNORMAL HIGH (ref 8–23)
CO2: 25 mmol/L (ref 22–32)
Calcium: 8.6 mg/dL — ABNORMAL LOW (ref 8.9–10.3)
Chloride: 98 mmol/L (ref 98–111)
Creatinine, Ser: 9.51 mg/dL — ABNORMAL HIGH (ref 0.44–1.00)
GFR calc Af Amer: 4 mL/min — ABNORMAL LOW (ref >60)
GFR calc non Af Amer: 4 mL/min — ABNORMAL LOW (ref >60)
Glucose, Bld: 315 mg/dL — ABNORMAL HIGH (ref 70–99)
Potassium: 7.3 mmol/L (ref 3.5–5.1)
Sodium: 139 mmol/L (ref 135–145)

## 2019-06-14 LAB — CREATININE, SERUM
Creatinine, Ser: 7.38 mg/dL — ABNORMAL HIGH (ref 0.44–1.00)
GFR calc Af Amer: 6 mL/min — ABNORMAL LOW (ref >60)
GFR calc non Af Amer: 5 mL/min — ABNORMAL LOW (ref >60)

## 2019-06-14 LAB — HEPATITIS B SURFACE ANTIGEN: Hepatitis B Surface Ag: NONREACTIVE

## 2019-06-14 LAB — GLUCOSE, CAPILLARY
Glucose-Capillary: 297 mg/dL — ABNORMAL HIGH (ref 70–99)
Glucose-Capillary: 86 mg/dL (ref 70–99)
Glucose-Capillary: 139 mg/dL — ABNORMAL HIGH (ref 70–99)

## 2019-06-14 LAB — CBC
HCT: 34.6 % — ABNORMAL LOW (ref 36.0–46.0)
Hemoglobin: 10.7 g/dL — ABNORMAL LOW (ref 12.0–15.0)
MCH: 32.1 pg (ref 26.0–34.0)
MCHC: 30.9 g/dL (ref 30.0–36.0)
MCV: 103.9 fL — ABNORMAL HIGH (ref 80.0–100.0)
Platelets: 132 10*3/uL — ABNORMAL LOW (ref 150–400)
RBC: 3.33 MIL/uL — ABNORMAL LOW (ref 3.87–5.11)
RDW: 14.1 % (ref 11.5–15.5)
WBC: 10.1 10*3/uL (ref 4.0–10.5)
nRBC: 0 % (ref 0.0–0.2)

## 2019-06-14 LAB — HEMOGLOBIN A1C
Hgb A1c MFr Bld: 8.7 % — ABNORMAL HIGH (ref 4.8–5.6)
Mean Plasma Glucose: 202.99 mg/dL

## 2019-06-14 MED ORDER — CINACALCET HCL 30 MG PO TABS: 60.0000 mg | ORAL_TABLET | ORAL | Status: DC

## 2019-06-14 MED ORDER — SODIUM CHLORIDE 0.9 % IV SOLN
1.0000 g | Freq: Once | INTRAVENOUS | Status: AC
  Administered 2019-06-14: 10:00:00 1 g via INTRAVENOUS
  Filled 2019-06-14: qty 10

## 2019-06-14 MED ORDER — ALTEPLASE 2 MG IJ SOLR
2.0000 mg | Freq: Once | INTRAMUSCULAR | Status: DC | PRN
  Filled 2019-06-14: qty 2

## 2019-06-14 MED ORDER — HYDROXYZINE HCL 25 MG PO TABS: 25.0000 mg | ORAL_TABLET | Freq: Three times a day (TID) | ORAL | Status: DC | PRN

## 2019-06-14 MED ORDER — ACETAMINOPHEN 500 MG PO TABS: 1000.0000 mg | ORAL_TABLET | Freq: Four times a day (QID) | ORAL | Status: DC | PRN

## 2019-06-14 MED ORDER — HEPARIN SODIUM (PORCINE) 5000 UNIT/ML IJ SOLN: 5000.0000 [IU] | Freq: Three times a day (TID) | INTRAMUSCULAR | Status: DC

## 2019-06-14 MED ORDER — PANTOPRAZOLE SODIUM 40 MG PO TBEC: 40.0000 mg | DELAYED_RELEASE_TABLET | Freq: Every day | ORAL | Status: DC

## 2019-06-14 MED ORDER — SEVELAMER CARBONATE 800 MG PO TABS: 4000.0000 mg | ORAL_TABLET | Freq: Three times a day (TID) | ORAL | Status: DC

## 2019-06-14 MED ORDER — INSULIN ASPART 100 UNIT/ML ~~LOC~~ SOLN
10.0000 [IU] | Freq: Once | SUBCUTANEOUS | Status: AC
  Administered 2019-06-14: 10:00:00 10 [IU] via INTRAVENOUS
  Filled 2019-06-14: qty 1

## 2019-06-14 MED ORDER — INSULIN GLARGINE 100 UNIT/ML ~~LOC~~ SOLN
15.0000 [IU] | Freq: Every day | SUBCUTANEOUS | Status: DC
  Filled 2019-06-14: qty 0.15

## 2019-06-14 MED ORDER — VITAMIN D 25 MCG (1000 UNIT) PO TABS: 1000.0000 [IU] | ORAL_TABLET | Freq: Every day | ORAL | Status: DC

## 2019-06-14 MED ORDER — SODIUM CHLORIDE 0.9 % IV SOLN: 100.0000 mL | INTRAVENOUS | Status: DC | PRN

## 2019-06-14 MED ORDER — HEPARIN SODIUM (PORCINE) 1000 UNIT/ML DIALYSIS
1000.0000 [IU] | INTRAMUSCULAR | Status: DC | PRN
  Filled 2019-06-14: qty 1

## 2019-06-14 MED ORDER — ACETAMINOPHEN 325 MG PO TABS: 650.0000 mg | ORAL_TABLET | Freq: Four times a day (QID) | ORAL | Status: DC | PRN

## 2019-06-14 MED ORDER — HYDRALAZINE HCL 25 MG PO TABS: 25.0000 mg | ORAL_TABLET | Freq: Three times a day (TID) | ORAL | Status: DC | PRN

## 2019-06-14 MED ORDER — CHLORHEXIDINE GLUCONATE CLOTH 2 % EX PADS
6.0000 | MEDICATED_PAD | Freq: Every day | CUTANEOUS | Status: DC
  Filled 2019-06-14: qty 6

## 2019-06-14 MED ORDER — ONDANSETRON HCL 4 MG/2ML IJ SOLN: 4.0000 mg | Freq: Three times a day (TID) | INTRAMUSCULAR | Status: DC | PRN

## 2019-06-14 MED ORDER — LIDOCAINE-PRILOCAINE 2.5-2.5 % EX CREA: 1.0000 "application " | TOPICAL_CREAM | CUTANEOUS | Status: DC | PRN

## 2019-06-14 MED ORDER — CINACALCET HCL 30 MG PO TABS
30.0000 mg | ORAL_TABLET | Freq: Every day | ORAL | Status: DC
  Filled 2019-06-14: qty 1

## 2019-06-14 MED ORDER — ASPIRIN EC 81 MG PO TBEC: 81.0000 mg | DELAYED_RELEASE_TABLET | Freq: Every day | ORAL | Status: DC

## 2019-06-14 MED ORDER — CALCITRIOL 0.25 MCG PO CAPS: 2.2500 ug | ORAL_CAPSULE | ORAL | Status: DC

## 2019-06-14 MED ORDER — SODIUM ZIRCONIUM CYCLOSILICATE 10 G PO PACK
10.0000 g | PACK | Freq: Once | ORAL | Status: AC
  Administered 2019-06-14: 10 g via ORAL
  Filled 2019-06-14: qty 1

## 2019-06-14 MED ORDER — ATORVASTATIN CALCIUM 80 MG PO TABS: 80.0000 mg | ORAL_TABLET | Freq: Every day | ORAL | Status: DC

## 2019-06-14 MED ORDER — GABAPENTIN 300 MG PO CAPS: 300.0000 mg | ORAL_CAPSULE | Freq: Every day | ORAL | Status: DC

## 2019-06-14 MED ORDER — INSULIN ASPART 100 UNIT/ML ~~LOC~~ SOLN: 0.0000 [IU] | SUBCUTANEOUS | Status: DC

## 2019-06-14 MED ORDER — ACETAMINOPHEN 325 MG PO TABS
650.0000 mg | ORAL_TABLET | Freq: Four times a day (QID) | ORAL | Status: DC | PRN
  Filled 2019-06-14: qty 2

## 2019-06-14 MED ORDER — SODIUM CHLORIDE 0.9 % IV BOLUS: 500.0000 mL | Freq: Once | INTRAVENOUS | Status: DC

## 2019-06-14 MED ORDER — MECLIZINE HCL 12.5 MG PO TABS
12.5000 mg | ORAL_TABLET | Freq: Three times a day (TID) | ORAL | Status: DC | PRN
  Filled 2019-06-14: qty 1

## 2019-06-14 MED ORDER — PENTAFLUOROPROP-TETRAFLUOROETH EX AERO: 1.0000 "application " | INHALATION_SPRAY | CUTANEOUS | Status: DC | PRN

## 2019-06-14 MED ORDER — DEXTROSE 50 % IV SOLN
25.0000 g | Freq: Once | INTRAVENOUS | Status: AC
  Administered 2019-06-14: 10:00:00 25 g via INTRAVENOUS
  Filled 2019-06-14: qty 50

## 2019-06-14 MED ORDER — LIDOCAINE HCL (PF) 1 % IJ SOLN: 5.0000 mL | INTRAMUSCULAR | Status: DC | PRN

## 2019-06-14 MED ORDER — CLOPIDOGREL BISULFATE 75 MG PO TABS: 75.0000 mg | ORAL_TABLET | Freq: Every day | ORAL | Status: DC

## 2019-06-14 MED ORDER — NITROGLYCERIN 0.4 MG SL SUBL: 0.4000 mg | SUBLINGUAL_TABLET | SUBLINGUAL | Status: DC | PRN

## 2019-06-14 NOTE — Progress Notes (Signed)
AVF positive for bruit and thrill

## 2019-06-14 NOTE — ED Provider Notes (Signed)
[facility] Emergency Department Provider Note  Time seen: 9:13 AM  I have reviewed the triage vital signs and the nursing notes.   HISTORY  Chief Complaint Dizziness   HPI Sally Lynch is a 64 y.o. female with a past medical history of CHF, CKD, diabetes, hypertension, presents to the emergency department for lightheadedness/dizziness.  According to the patient she went to dialysis this morning for her routine dialysis and felt very dizzy which she describes as a lightheaded sensation like she was going to pass out.  Patient states she had to leave dialysis because she thought she left her door unlocked.  Patient states her door was locked and she returned back to dialysis but felt lightheaded still so they brought her to the emergency department via EMS for evaluation.  Here the patient states continues to feel lightheaded at times which she describes as more of a near syncope type sensation.  Patient states she had her last full dialysis on Friday.  Patient denies any fever cough or shortness of breath.  Denies abdominal pain nausea vomiting or diarrhea.  Patient does not produce urine.   Past Medical History:  Diagnosis Date  . Anginal pain ([place])   . Arthritis   . CHF (congestive heart failure) ([place])   . Chronic kidney disease   . Coronary artery disease   . Diabetes mellitus without complication ([place])   . GERD (gastroesophageal reflux disease)   . Hypertension   . Myocardial infarction [facility])     Patient Active Problem List   Diagnosis Date Noted  . Chronic kidney disease (CKD) 03/04/2019  . Coronary artery disease 03/04/2019  . GERD (gastroesophageal reflux disease) 08/01/2017  . Left medial tibial plateau fracture 04/19/2016  . End stage renal disease ([place]) 01/18/2016  . Complication of vascular access for dialysis 01/18/2016  . Essential hypertension 01/18/2016  . Type 2 diabetes mellitus treated with insulin ([place]) 01/18/2016  . Hyperlipidemia  01/18/2016  . Type 2 diabetes mellitus with hyperglycemia ([place]) 07/24/2015  . Allergic rhinitis 12/08/2013  . Nasal obstruction 12/08/2013  . Patient awaiting renal transplant 11/12/2013  . Organ transplant candidate 11/12/2013  . Heart disease 08/16/2013  . Left ventricular dysfunction 08/16/2013  . Disorder of kidney and ureter 06/24/2012  . Epiretinal membrane 06/11/2012  . Vitamin D deficiency 06/30/2006  . Absence of both cervix and uterus, acquired 04/06/2006  . H/O hysterectomy for benign disease 04/06/2006  . Chronic ischemic heart disease 10/24/2004  . Heart failure, unspecified ([place]) 10/01/2004    Past Surgical History:  Procedure Laterality Date  . A/V FISTULAGRAM Left 01/06/2018   Procedure: A/V FISTULAGRAM;  Surgeon: Katha Cabal, MD;  Location: [place] [facility];  Service: Cardiovascular;  Laterality: Left;  . AV FISTULA PLACEMENT  2011  . CARDIAC CATHETERIZATION    . EYE SURGERY     bilateral cararact surgery  . PERIPHERAL VASCULAR CATHETERIZATION N/A 07/12/2015   Procedure: A/V Shuntogram/Fistulagram;  Surgeon: Katha Cabal, MD;  Location: [place] [facility];  Service: Cardiovascular;  Laterality: N/A;    Prior to Admission medications   Medication Sig Start Date End Date Taking? Authorizing Provider  acetaminophen (TYLENOL) 325 MG tablet Take 650 mg by mouth every 4 (four) hours as needed for moderate pain.    [provider]  aspirin 81 MG tablet Take 81 mg by mouth daily.    [provider]  atorvastatin (LIPITOR) 20 MG tablet Take 80 mg by mouth daily.  [provider]  calcitRIOL (ROCALTROL) 0.25 MCG capsule Take by mouth. 10/20/17   [provider]  cholecalciferol (VITAMIN D) 1000 units tablet Take 1,000 Units by mouth daily.    [provider]  cinacalcet (SENSIPAR) 90 MG tablet Take 90 mg by mouth daily.    [provider]  clopidogrel (PLAVIX) 75 MG tablet Take 75 mg by mouth daily.     [provider]  fluticasone (FLONASE) 50 MCG/ACT nasal spray Place into both nostrils 2 (two) times daily as needed.     [provider]  gabapentin (NEURONTIN) 300 MG capsule Take 300 mg by mouth daily.    [provider]  hydrOXYzine (ATARAX/VISTARIL) 25 MG tablet Take 25 mg by mouth 3 (three) times daily as needed.    [provider]  insulin glargine (LANTUS) 100 UNIT/ML injection Inject 20 Units into the skin at bedtime.    [provider]  lidocaine-prilocaine (EMLA) cream Apply topically. 07/19/17   [provider]  losartan (COZAAR) 25 MG tablet Take 25 mg by mouth daily.    [provider]  metoprolol succinate (TOPROL-XL) 25 MG 24 hr tablet Take 25 mg by mouth daily.     [provider]  midodrine (PROAMATINE) 5 MG tablet Take 1 tab 1/2 hr before dialysis, take 1 tab mid treatment prn hypotension 05/22/16   [provider]  nitroGLYCERIN (NITROSTAT) 0.4 MG SL tablet Place 0.4 mg under the tongue every 5 (five) minutes as needed for chest pain.    [provider]  pantoprazole (PROTONIX) 40 MG tablet Take 40 mg by mouth daily.    [provider]  sevelamer carbonate (RENVELA) 800 MG tablet  09/10/13   [provider]  triamcinolone cream (KENALOG) 0.1 % Mix 1:1 w Eucerin cream.  Apply to dry itchy skin twice daily prn 10/26/13   [provider]    Allergies  Allergen Reactions  . Shellfish Allergy Hives  . Contrast Media [Iodinated Diagnostic Agents] Hives  . Furosemide Rash  . Other Rash    Raw fruits  . Peanut Oil Rash    Family History  Problem Relation Age of Onset  . Hypertension Mother   . Diabetes Mother   . Diabetes Father   . Heart attack Father     Social History Social History   Tobacco Use  . Smoking status: Never Smoker  . Smokeless tobacco: Never Used  Substance Use Topics  . Alcohol use: No  . Drug use: No    Review of  Systems Constitutional: Negative for fever.  Positive for dizziness/lightheadedness. Cardiovascular: Negative for chest pain. Respiratory: Negative for shortness of breath. Gastrointestinal: Negative for abdominal pain, vomiting and diarrhea. Genitourinary: Does not produce urine. Musculoskeletal: Negative for musculoskeletal complaints Neurological: Negative for headache All other ROS negative  ____________________________________________   PHYSICAL EXAM:  VITAL SIGNS: ED Triage Vitals  Enc Vitals Group     BP 06/14/19 0657 104/69     Pulse Rate 06/14/19 0657 (!) 59     Resp 06/14/19 0657 18     Temp 06/14/19 0657 98.2 F (36.8 C)     Temp Source 06/14/19 0657 Oral     SpO2 06/14/19 0657 98 %     Weight 06/14/19 0658 192 lb 14.4 oz (87.5 kg)     Height 06/14/19 0658 5\' 4"  (1.626 m)     Head Circumference --      Peak Flow --      Pain Score  06/14/19 0657 0     Pain Loc --      Pain Edu? --      Excl. in [place]? --    Constitutional: Alert and oriented. Well appearing and in no distress. Eyes: Normal exam ENT      Head: Normocephalic and atraumatic.      Mouth/Throat: Mucous membranes are moist. Cardiovascular: Normal rate, regular rhythm.  Respiratory: Normal respiratory effort without tachypnea nor retractions. Breath sounds are clear  Gastrointestinal: Soft and nontender. No distention.   Musculoskeletal: Nontender with normal range of motion in all extremities.  Neurologic:  Normal speech and language. No gross focal neurologic deficits Skin:  Skin is warm, dry and intact.  Psychiatric: Mood and affect are normal.  ____________________________________________    EKG  EKG viewed and interpreted by myself shows a junctional rhythm at 47 bpm, widened QRS with a normal axis, largely normal intervals with nonspecific ST changes.  ____________________________________________    RADIOLOGY  CT head shows no acute  finding.  ____________________________________________   INITIAL IMPRESSION / ASSESSMENT AND PLAN / ED COURSE  Pertinent labs & imaging results that were available during my care of the patient were reviewed by me and considered in my medical decision making (see chart for details).   Patient is work-up today shows hyperkalemia to 7.3.  Patient is EKG shows a widened QRS with what appears to be more of a junctional rhythm at 47 bpm.  This could very likely be the cause of the patient's lightheadedness/dizziness she is experiencing today.  Her last EKG we have on file is from 2009 but at that time showed a narrow QRS.  We will dose 1 amp of calcium gluconate, we will dose insulin, glucose and Lokelma.  We will IV hydrate with 500 cc of normal saline.  I spoke to nephrology who will arrange for dialysis for the patient.  We will admit to the hospitalist service for further treatment.  Remainder the patient's lab work shows an elevated blood glucose of 315.  With mild anemia hemoglobin 10.7.  KAYDYN CHISM was evaluated in Emergency Department on 06/14/2019 for the symptoms described in the history of present illness. She was evaluated in the context of the global COVID-19 pandemic, which necessitated consideration that the patient might be at risk for infection with the SARS-CoV-2 virus that causes COVID-19. Institutional protocols and algorithms that pertain to the evaluation of patients at risk for COVID-19 are in a state of rapid change based on information released by regulatory bodies including the CDC and federal and state organizations. These policies and algorithms were followed during the patient's care in the ED.  CRITICAL CARE Performed by: Harvest Dark   Total critical care time: 45 minutes  Critical care time was exclusive of separately billable procedures and treating other patients.  Critical care was necessary to treat or prevent imminent or life-threatening  deterioration.  Critical care was time spent personally by me on the following activities: development of treatment plan with patient and/or surrogate as well as nursing, discussions with consultants, evaluation of patient's response to treatment, examination of patient, obtaining history from patient or surrogate, ordering and performing treatments and interventions, ordering and review of laboratory studies, ordering and review of radiographic studies, pulse oximetry and re-evaluation of patient's condition.  ____________________________________________   FINAL CLINICAL IMPRESSION(S) / ED DIAGNOSES  Hyperkalemia Weakness   Harvest Dark, MD 06/14/19 [phone]

## 2019-06-14 NOTE — Progress Notes (Signed)
Dr Holley Raring notified of patients request to remove 2 kg this treatment.

## 2019-06-14 NOTE — Consult Note (Signed)
[facility] CONSULT NOTE    Date: 06/14/2019                  Patient Name:  Sally Lynch  MRN: [id number]  DOB: 1955-08-29  Age / Sex: 64 y.o., female         PCP: Rogelia Rohrer, MD                 Service Requesting Consult:  Emergency department                 Reason for Consult:  Severe hyperkalemia            History of Present Illness: Patient is a 64 y.o. female with a PMHx of congestive heart failure, coronary artery disease, diabetes mellitus type 2, ESRD on HD MWF, hypertension, diabetic retinopathy, anemia of chronic kidney disease, secondary hyperparathyroidism, who was admitted to [facility] on 06/14/2019 for evaluation of significant dizziness.  Patient reports that this developed when she went to her dialysis center today.  She states that she needed to go outside to get a breath of air.  Subsequently brought to the emergency department.  Patient found to have severe hyperkalemia upon presentation here with a potassium of 7.3.  This was associated with EKG changes.  Patient was given insulin and D50 in the emergency department as well as calcium gluconate.  She was subsequently brought down urgently for dialysis treatment.  Patient seen and evaluated during dialysis treatment and found to be tolerating this quite well.  She was initiated on a 1K bath.   Medications: Outpatient medications: Medications Prior to Admission  Medication Sig Dispense Refill Last Dose  . acetaminophen (TYLENOL) 325 MG tablet Take 650 mg by mouth every 4 (four) hours as needed for moderate pain.   prn at prn  . aspirin 81 MG tablet Take 81 mg by mouth daily.   06/13/2019 at Unknown time  . atorvastatin (LIPITOR) 80 MG tablet Take 80 mg by mouth daily.    06/13/2019 at Unknown time  . calcitRIOL (ROCALTROL) 0.25 MCG capsule Take 2.25 mcg by mouth 3 (three) times a week.    06/11/2019 at Unknown time  . cholecalciferol (VITAMIN D) 1000 units tablet Take 1,000 Units by mouth daily.    06/13/2019 at Unknown time  . cinacalcet (SENSIPAR) 30 MG tablet Take 30 mg by mouth daily. Alternating with 60 mg   Past Week at Unknown time  . cinacalcet (SENSIPAR) 60 MG tablet Take 60 mg by mouth every other day. alternating with 30 mg   06/13/2019 at Unknown time  . clopidogrel (PLAVIX) 75 MG tablet Take 75 mg by mouth daily.   06/13/2019 at Unknown time  . gabapentin (NEURONTIN) 300 MG capsule Take 300-600 mg by mouth at bedtime.    06/13/2019 at Unknown time  . hydrOXYzine (ATARAX/VISTARIL) 25 MG tablet Take 25 mg by mouth 3 (three) times daily as needed.   prn at prn  . insulin glargine (LANTUS) 100 UNIT/ML injection Inject 30 Units into the skin daily.    06/13/2019 at Unknown time  . lidocaine-prilocaine (EMLA) cream Apply topically.   prn at prn  . metoprolol succinate (TOPROL-XL) 25 MG 24 hr tablet Take 25 mg by mouth. Non-dialysis days   06/13/2019 at Unknown time  . midodrine (PROAMATINE) 5 MG tablet Take 1 tab 1/2 hr before dialysis, take 1 tab mid treatment prn hypotension   prn at prn  . nitroGLYCERIN (NITROSTAT) 0.4 MG SL tablet  Place 0.4 mg under the tongue every 5 (five) minutes as needed for chest pain.   prn at prn  . pantoprazole (PROTONIX) 40 MG tablet Take 40 mg by mouth daily.   06/13/2019 at Unknown time  . sevelamer carbonate (RENVELA) 800 MG tablet Take 4,000 mg by mouth 3 (three) times daily.    06/13/2019 at Unknown time  . sodium polystyrene (KAYEXALATE) 15 GM/60ML suspension Take 20 g by mouth every other day.    Past Week at Unknown time  . triamcinolone cream (KENALOG) 0.1 % Mix 1:1 w Eucerin cream.  Apply to dry itchy skin twice daily prn   prn at prn    Current medications: Current Facility-Administered Medications  Medication Dose Route Frequency Provider Last Rate Last Admin  . 0.9 %  sodium chloride infusion  100 mL Intravenous PRN Beckham Capistran, MD      . 0.9 %  sodium chloride infusion  100 mL Intravenous PRN Daxtyn Rottenberg, MD      . acetaminophen  (TYLENOL) tablet 650 mg  650 mg Oral Q6H PRN Ivor Costa, MD      . alteplase (CATHFLO ACTIVASE) injection 2 mg  2 mg Intracatheter Once PRN Savien Mamula, MD      . Chlorhexidine Gluconate Cloth 2 % PADS 6 each  6 each Topical Q0600 Ivor Costa, MD      . heparin injection 1,000 Units  1,000 Units Dialysis PRN Holley Raring, Josian Lanese, MD      . heparin injection 5,000 Units  5,000 Units Subcutaneous Q8H Ivor Costa, MD      . hydrALAZINE (APRESOLINE) tablet 25 mg  25 mg Oral TID PRN Ivor Costa, MD      . insulin aspart (novoLOG) injection 0-9 Units  0-9 Units Subcutaneous Q4H Ivor Costa, MD      . lidocaine (PF) (XYLOCAINE) 1 % injection 5 mL  5 mL Intradermal PRN Jennifier Smitherman, MD      . lidocaine-prilocaine (EMLA) cream 1 application  1 application Topical PRN Malu Pellegrini, MD      . ondansetron (ZOFRAN) injection 4 mg  4 mg Intravenous Q8H PRN Ivor Costa, MD      . pentafluoroprop-tetrafluoroeth (GEBAUERS) aerosol 1 application  1 application Topical PRN Deven Furia, MD      . sodium chloride 0.9 % bolus 500 mL  500 mL Intravenous Once Ivor Costa, MD          Allergies: Allergies  Allergen Reactions  . Shellfish Allergy Hives  . Contrast Media [Iodinated Diagnostic Agents] Hives  . Furosemide Rash  . Other Rash    Raw fruits  . Peanut Oil Rash      Past Medical History: Past Medical History:  Diagnosis Date  . Anginal pain ([place])   . Arthritis   . CHF (congestive heart failure) ([place])   . Chronic kidney disease   . Coronary artery disease   . Diabetes mellitus without complication ([place])   . GERD (gastroesophageal reflux disease)   . Hypertension   . Myocardial infarction [facility])      Past Surgical History: Past Surgical History:  Procedure Laterality Date  . A/V FISTULAGRAM Left 01/06/2018   Procedure: A/V FISTULAGRAM;  Surgeon: Katha Cabal, MD;  Location: [place] [facility];  Service: Cardiovascular;  Laterality: Left;  . AV FISTULA PLACEMENT  2011  . CARDIAC  CATHETERIZATION    . EYE SURGERY     bilateral cararact surgery  . PERIPHERAL VASCULAR CATHETERIZATION N/A 07/12/2015   Procedure:  A/V Shuntogram/Fistulagram;  Surgeon: Katha Cabal, MD;  Location: [place] [facility];  Service: Cardiovascular;  Laterality: N/A;     Family History: Family History  Problem Relation Age of Onset  . Hypertension Mother   . Diabetes Mother   . Diabetes Father   . Heart attack Father      Social History: Social History   Socioeconomic History  . Marital status: Widowed    Spouse name: Not on file  . Number of children: Not on file  . Years of education: Not on file  . Highest education level: Not on file  Occupational History  . Not on file  Tobacco Use  . Smoking status: Never Smoker  . Smokeless tobacco: Never Used  Substance and Sexual Activity  . Alcohol use: No  . Drug use: No  . Sexual activity: Never    Birth control/protection: Post-menopausal  Other Topics Concern  . Not on file  Social History Narrative  . Not on file   Social Determinants of Health   Financial Resource Strain:   . Difficulty of Paying Living Expenses:   Food Insecurity:   . Worried About Charity fundraiser in the Last Year:   . Arboriculturist in the Last Year:   Transportation Needs:   . Film/video editor (Medical):   Marland Kitchen Lack of Transportation (Non-Medical):   Physical Activity:   . Days of Exercise per Week:   . Minutes of Exercise per Session:   Stress:   . Feeling of Stress :   Social Connections:   . Frequency of Communication with Friends and Family:   . Frequency of Social Gatherings with Friends and Family:   . Attends Religious Services:   . Active Member of Clubs or Organizations:   . Attends Archivist Meetings:   Marland Kitchen Marital Status:   Intimate Partner Violence:   . Fear of Current or Ex-Partner:   . Emotionally Abused:   Marland Kitchen Physically Abused:   . Sexually Abused:      Review of Systems: As per HPI  Vital  Signs: Blood pressure 127/69, pulse 74, temperature 98.8 F (37.1 C), temperature source Oral, resp. rate 15, height 5\' 4"  (1.626 m), weight 87.5 kg, SpO2 100 %.  Weight trends: Filed Weights   06/14/19 0658  Weight: 87.5 kg    Physical Exam: General: NAD, resting comfortably in dialysis chair  Head: Normocephalic, atraumatic.  Eyes: Anicteric, EOMI  Nose: Mucous membranes moist, not inflammed, nonerythematous.  Throat: Oropharynx nonerythematous, no exudate appreciated.   Neck: Supple, trachea midline.  Lungs:  Normal respiratory effort. Clear to auscultation BL without crackles or wheezes.  Heart: RRR. S1 and S2 normal without gallop, murmur, or rubs.  Abdomen:  BS normoactive. Soft, Nondistended, non-tender.  No masses or organomegaly.  Extremities: No pretibial edema.  Neurologic: A&O X3, Motor strength is 5/5 in the all 4 extremities  Skin: No visible rashes, scars.    Lab results: Basic Metabolic Panel: Recent Labs  Lab 06/14/19 0738 06/14/19 1140  NA 139  --   K 7.3*  --   CL 98  --   CO2 25  --   GLUCOSE 315*  --   BUN 61*  --   CREATININE 9.51* 7.38*  CALCIUM 8.6*  --     Liver Function Tests: No results for input(s): AST, ALT, ALKPHOS, BILITOT, PROT, ALBUMIN in the last 168 hours. No results for input(s): LIPASE, AMYLASE in the last 168 hours.  No results for input(s): AMMONIA in the last 168 hours.  CBC: Recent Labs  Lab 06/14/19 0738  WBC 10.1  HGB 10.7*  HCT 34.6*  MCV 103.9*  PLT 132*    Cardiac Enzymes: No results for input(s): CKTOTAL, CKMB, CKMBINDEX, TROPONINI in the last 168 hours.  BNP: Invalid input(s): POCBNP  CBG: Recent Labs  Lab 06/14/19 0728  GLUCAP 297*    Microbiology: No results found for this or any previous visit.  Coagulation Studies: No results for input(s): LABPROT, INR in the last 72 hours.  Urinalysis: No results for input(s): COLORURINE, LABSPEC, PHURINE, GLUCOSEU, HGBUR, BILIRUBINUR, KETONESUR,  PROTEINUR, UROBILINOGEN, NITRITE, LEUKOCYTESUR in the last 72 hours.  Invalid input(s): APPERANCEUR    Imaging: CT Head Wo Contrast  Result Date: 06/14/2019 CLINICAL DATA:  Lethargy and dizziness EXAM: CT HEAD WITHOUT CONTRAST TECHNIQUE: Contiguous axial images were obtained from the base of the skull through the vertex without intravenous contrast. COMPARISON:  April 30, 2007 FINDINGS: Brain: There is mild diffuse atrophy. There is no intracranial mass, hemorrhage, extra-axial fluid collection, or midline shift. There is mild small vessel disease in the centra semiovale bilaterally. Elsewhere brain parenchyma appears unremarkable. No evident acute infarct. Vascular: There is no hyperdense vessel. There is calcification in the distal vertebral arteries and carotid siphon regions bilaterally. Skull: The bony calvarium appears intact. Sinuses/Orbits: Visualized paranasal sinuses are clear. Visualized orbits appear symmetric bilaterally. Other: Visualized mastoid air cells are clear. 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 No evident acute infarct. No mass or hemorrhage. There are foci of arterial vascular calcification. Electronically Signed   By: Lowella Grip III M.D.   On: 06/14/2019 08:06      Assessment & Plan: Pt is a 64 y.o. female with a PMHx of congestive heart failure, coronary artery disease, diabetes mellitus type 2, ESRD on HD MWF, hypertension, diabetic retinopathy, anemia of chronic kidney disease, secondary hyperparathyroidism, who was admitted to [facility] on 06/14/2019 for evaluation of significant dizziness.  Patient ultimately found to have severe hyperkalemia with serum potassium of 7.3.  [facility]/MWF/85kg.   1.  ESRD on HD MWF with severe Hyperkalemia.  Patient found to have severe hyperkalemia.  It appears that she is been having some tomato based foods at home recently.  She states that she has been experimenting with [place] food.   Patient taken down for urgent dialysis today.  Patient seen and evaluated during the dialysis treatment and tolerating well.  She has been initiated on a 1K dialysis bath.  We plan to complete dialysis treatment today.  Reassess serum potassium tomorrow.  2.  Anemia of chronic kidney disease.  Hemoglobin 10.7.  Patient will resume Mircera as an outpatient.  3.  Secondary hyperparathyroidism.  Continue to monitor abdominal metabolism parameters over the course of the hospitalization.  4.  Thanks for consultation.

## 2019-06-14 NOTE — Progress Notes (Signed)
Change bath to 2k during last hour of treatment, TO Dr Holley Raring

## 2019-06-14 NOTE — Progress Notes (Signed)
Hemodialysis patient known at [place] MWF 5:15am, patient states that she drive self to treatments. Please contact me with dialysis placement concerns.

## 2019-06-14 NOTE — ED Notes (Signed)
Pt transported to CT ?

## 2019-06-14 NOTE — ED Notes (Signed)
Pharmacy notified to send Calcium Gluconate dose.

## 2019-06-14 NOTE — H&P (Signed)
History and Physical    Sally Lynch:[id number] DOB: 07/30/1955 DOA: 06/14/2019  Referring MD/NP/PA:   PCP: Rogelia Rohrer, MD   Patient coming from:  The patient is coming from home.  At baseline, pt is independent for most of ADL.        Chief Complaint: Dizziness and lightheadedness  HPI: Sally Lynch is a 64 y.o. female with medical history significant of ESRD-HD, hypertension, hyperlipidemia, diabetes mellitus, GERD, CAD, CHF, who presents with dizziness and lightheadedness  Pt states that she had her dialysis on Friday.  She went to dialysis center this morning for her routine dialysis, but she started feeling dizzy and lightheadedness.  She feels like she was going to pass out, but did not. Patient states she had to leave dialysis because she thought she left her door unlocked. Patient states her door was locked and she returned back to dialysis,  but she still has dizziness and lightheadedness. Therefore patient was sent to emergency room for further evaluation and treatment.  Patient does not feel room spinning around her.  She states that she has mild right ear ringing.  No vision change or hearing loss.  No unilateral numbness, weakness or tingling his extremities.  No facial droop or slurred speech. Patient has mild shortness breath, but no chest pain, cough, fever or chills.  No nausea vomiting, diarrhea, abdominal pain, symptoms of UTI.   ED Course: pt was found to have potassium of 7.3, EKG showed junctional rhythm with QRS widening and bradycardia.  Pending COVID-19 PCR, WBC 10.1, bicarbonate 25, creatinine 9.51, BUN 61, temperature normal, blood pressure 128/65, heart rate 59, oxygen saturation 98 to 97% on room air, RR 15.  CT head is negative for acute intracranial abnormalities. Pt is placed on telemetry bed for observation.  Renal was consulted for urgent dialysis.  Review of Systems:   General: no fevers, chills, no body weight gain, has fatigue HEENT: no blurry  vision, hearing changes or sore throat Respiratory: has dyspnea, no coughing, wheezing CV: no chest pain, no palpitations GI: no nausea, vomiting, abdominal pain, diarrhea, constipation GU: no dysuria, burning on urination, increased urinary frequency, hematuria  Ext: no leg edema Neuro: no unilateral weakness, numbness, or tingling, no vision change or hearing loss.  Has lightheadedness and dizziness. Skin: no rash, no skin tear. MSK: No muscle spasm, no deformity, no limitation of range of movement in spin Heme: No easy bruising.  Travel history: No recent long distant travel.  Allergy:  Allergies  Allergen Reactions  . Shellfish Allergy Hives  . Contrast Media [Iodinated Diagnostic Agents] Hives  . Furosemide Rash  . Other Rash    Raw fruits  . Peanut Oil Rash    Past Medical History:  Diagnosis Date  . Anginal pain ([place])   . Arthritis   . CHF (congestive heart failure) ([place])   . Chronic kidney disease   . Coronary artery disease   . Diabetes mellitus without complication ([place])   . GERD (gastroesophageal reflux disease)   . Hypertension   . Myocardial infarction [facility])     Past Surgical History:  Procedure Laterality Date  . A/V FISTULAGRAM Left 01/06/2018   Procedure: A/V FISTULAGRAM;  Surgeon: Katha Cabal, MD;  Location: [facility];  Service: Cardiovascular;  Laterality: Left;  . AV FISTULA PLACEMENT  2011  . CARDIAC CATHETERIZATION    . EYE SURGERY     bilateral cararact surgery  . PERIPHERAL VASCULAR CATHETERIZATION N/A 07/12/2015  Procedure: A/V Shuntogram/Fistulagram;  Surgeon: Katha Cabal, MD;  Location: [facility];  Service: Cardiovascular;  Laterality: N/A;    Social History:  reports that she has never smoked. She has never used smokeless tobacco. She reports that she does not drink alcohol or use drugs.  Family History:  Family History  Problem Relation Age of Onset  . Hypertension Mother   . Diabetes Mother   .  Diabetes Father   . Heart attack Father      Prior to Admission medications   Medication Sig Start Date End Date Taking? Authorizing Provider  acetaminophen (TYLENOL) 325 MG tablet Take 650 mg by mouth every 4 (four) hours as needed for moderate pain.    [provider]  aspirin 81 MG tablet Take 81 mg by mouth daily.    [provider]  atorvastatin (LIPITOR) 20 MG tablet Take 80 mg by mouth daily.     [provider]  calcitRIOL (ROCALTROL) 0.25 MCG capsule Take by mouth. 10/20/17   [provider]  cholecalciferol (VITAMIN D) 1000 units tablet Take 1,000 Units by mouth daily.    [provider]  cinacalcet (SENSIPAR) 90 MG tablet Take 90 mg by mouth daily.    [provider]  clopidogrel (PLAVIX) 75 MG tablet Take 75 mg by mouth daily.    [provider]  fluticasone (FLONASE) 50 MCG/ACT nasal spray Place into both nostrils 2 (two) times daily as needed.     [provider]  gabapentin (NEURONTIN) 300 MG capsule Take 300 mg by mouth daily.    [provider]  hydrOXYzine (ATARAX/VISTARIL) 25 MG tablet Take 25 mg by mouth 3 (three) times daily as needed.    [provider]  insulin glargine (LANTUS) 100 UNIT/ML injection Inject 20 Units into the skin at bedtime.    [provider]  lidocaine-prilocaine (EMLA) cream Apply topically. 07/19/17   [provider]  losartan (COZAAR) 25 MG tablet Take 25 mg by mouth daily.    [provider]  metoprolol succinate (TOPROL-XL) 25 MG 24 hr tablet Take 25 mg by mouth daily.     [provider]  midodrine (PROAMATINE) 5 MG tablet Take 1 tab 1/2 hr before dialysis, take 1 tab mid treatment prn hypotension 05/22/16   [provider]  nitroGLYCERIN (NITROSTAT) 0.4 MG SL tablet Place 0.4 mg under the tongue every 5 (five) minutes as needed for chest pain.    [provider]  pantoprazole (PROTONIX) 40 MG tablet Take 40 mg  by mouth daily.    [provider]  sevelamer carbonate (RENVELA) 800 MG tablet  09/10/13   [provider]  triamcinolone cream (KENALOG) 0.1 % Mix 1:1 w Eucerin cream.  Apply to dry itchy skin twice daily prn 10/26/13   [provider]    Physical Exam: Vitals:   06/14/19 0700 06/14/19 0807 06/14/19 0900 06/14/19 1000  BP: (!) 122/58 128/65 125/75 (!) 126/28  Pulse:    (!) 44  Resp: 19 15 17 18   Temp:      TempSrc:      SpO2: 97% 90% 90% 98%  Weight:      Height:       General: Not in acute distress HEENT:       Eyes: PERRL, EOMI, no scleral icterus.       ENT: No discharge from the ears and nose, no pharynx injection, no tonsillar enlargement.  Neck: No JVD, no bruit, no mass felt. Heme: No neck lymph node enlargement. Cardiac: S1/S2, RRR, bradycardia, no murmurs, No gallops or rubs. Respiratory:  No rales, wheezing, rhonchi or rubs. GI: Soft, nondistended, nontender, no rebound pain, no organomegaly, BS present. GU: No hematuria Ext: No pitting leg edema bilaterally. 2+DP/PT pulse bilaterally. Musculoskeletal: No joint deformities, No joint redness or warmth, no limitation of ROM in spin. Skin: No rashes.  Neuro: Alert, oriented X3, cranial nerves II-XII grossly intact, moves all extremities normally. Psych: Patient is not psychotic, no suicidal or hemocidal ideation.  Labs on Admission: I have personally reviewed following labs and imaging studies  CBC: Recent Labs  Lab 06/14/19 0738  WBC 10.1  HGB 10.7*  HCT 34.6*  MCV 103.9*  PLT 032*   Basic Metabolic Panel: Recent Labs  Lab 06/14/19 0738  NA 139  K 7.3*  CL 98  CO2 25  GLUCOSE 315*  BUN 61*  CREATININE 9.51*  CALCIUM 8.6*   GFR: Estimated Creatinine Clearance: 6.3 mL/min (A) (by C-G formula based on SCr of 9.51 mg/dL (H)). Liver Function Tests: No results for input(s): AST, ALT, ALKPHOS, BILITOT, PROT, ALBUMIN in the last 168 hours. No results for input(s): LIPASE,  AMYLASE in the last 168 hours. No results for input(s): AMMONIA in the last 168 hours. Coagulation Profile: No results for input(s): INR, PROTIME in the last 168 hours. Cardiac Enzymes: No results for input(s): CKTOTAL, CKMB, CKMBINDEX, TROPONINI in the last 168 hours. BNP (last 3 results) No results for input(s): PROBNP in the last 8760 hours. HbA1C: No results for input(s): HGBA1C in the last 72 hours. CBG: Recent Labs  Lab 06/14/19 0728  GLUCAP 297*   Lipid Profile: No results for input(s): CHOL, HDL, LDLCALC, TRIG, CHOLHDL, LDLDIRECT in the last 72 hours. Thyroid Function Tests: No results for input(s): TSH, T4TOTAL, FREET4, T3FREE, THYROIDAB in the last 72 hours. Anemia Panel: No results for input(s): VITAMINB12, FOLATE, FERRITIN, TIBC, IRON, RETICCTPCT in the last 72 hours. Urine analysis: No results found for: COLORURINE, APPEARANCEUR, LABSPEC, PHURINE, GLUCOSEU, HGBUR, BILIRUBINUR, KETONESUR, PROTEINUR, UROBILINOGEN, NITRITE, LEUKOCYTESUR Sepsis Labs: @LABRCNTIP (procalcitonin:4,lacticidven:4) )No results found for this or any previous visit (from the past 240 hour(s)).   Radiological Exams on Admission: CT Head Wo Contrast  Result Date: 06/14/2019 CLINICAL DATA:  Lethargy and dizziness EXAM: CT HEAD WITHOUT CONTRAST TECHNIQUE: Contiguous axial images were obtained from the base of the skull through the vertex without intravenous contrast. COMPARISON:  April 30, 2007 FINDINGS: Brain: There is mild diffuse atrophy. There is no intracranial mass, hemorrhage, extra-axial fluid collection, or midline shift. There is mild small vessel disease in the centra semiovale bilaterally. Elsewhere brain parenchyma appears unremarkable. No evident acute infarct. Vascular: There is no hyperdense vessel. There is calcification in the distal vertebral arteries and carotid siphon regions bilaterally. Skull: The bony calvarium appears intact. Sinuses/Orbits: Visualized paranasal sinuses are  clear. Visualized orbits appear symmetric bilaterally. Other: Visualized mastoid air cells are clear. 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 No evident acute infarct. No mass or hemorrhage. There are foci of arterial vascular calcification. Electronically Signed   By: Lowella Grip III M.D.   On: 06/14/2019 08:06     EKG: Independently reviewed.  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  EKG has junctional rhythm, bradycardia, widening QRS wave.  Patient states that she has been compliant to dialysis.  -Placed on telemetry bed for observation -Patient was treated with 1 g of calcium gluconate, 10 g of Lokelma, D50 and NovoLog in ED. -Renal was consulted for urgent dialysis  ESRD on dialysis (MWF): -Dr. Holley Raring is consulted for urgent dialysis  Dizziness and and lightheadedness: Patient still has dizziness when getting up.  Possibly due to bradycardia secondary to hyperkalemia.  CT head is negative for acute intracranial abnormalities. -Will not check orthostatic status now since patient has severe dizziness when getting up -Treat hyperkalemia as above -Frequent neuro check -If patient still has persistent dizziness and lightheadedness after dialysis, will consider further imaging such as MRI of her brain and PT OT  Essential hypertension -Hold metoprolol due to bradycardia -IV hydralazine as needed  Hyperlipidemia -Lipitor  Coronary artery disease: s/p of stent. 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 Patient is taking Lantus at home -will decrease Lantus dose from 20 to 15 units daily  -SSI -Check A1c      DVT ppx: SQ Heparin     Code Status: Full code Family Communication: not done, no family member is at bed side.  Disposition Plan:  Anticipate discharge back to previous home environment Consults called:  Dr. Holley Raring of renal Admission status: Tele bed for obs     Date of Service 06/14/2019    [facility]   If 7PM-7AM, please contact night-coverage [email or web address] 06/14/2019, 10:11 AM

## 2019-06-14 NOTE — Progress Notes (Signed)
HD treatment completed. Pt is alert and oriented.

## 2019-06-14 NOTE — ED Notes (Signed)
Hospitalist to bedside.

## 2019-06-14 NOTE — ED Triage Notes (Signed)
Pt comes from dialysis via EMS today. EMS states pt earlier arrived to dialysis, was lethargic and dizzy, left because she thought she had left her door unlocked at home, then returned and complained of same. EMS reports dialysis nurse called 911 due to pt not acting right, complaining of being very dizzy and hot.  Pt on arrival is A&Ox4 and answering questions appropriately. PTs CBG was in 300s, BP 161/88, HR 59, SPO2 98% on RA

## 2019-06-14 NOTE — Progress Notes (Signed)
This patient has stated she is leaving hospital AMA. She complains that she wanted Tylenol extra strength and was not provided this. Day shift nurse reportedly sent message to provider requesting this, after having given her Tylenol 650 mg po for headache at change of shift.   A message was sent to on call APP stating that patient has stated she will leave in the am if she cannot get extra strength tylenol. Patient then stated she was leaving tonight. Pt was escorted to [place] to exit building.   Patient has removed her IV from right [facility] and site is without bleeding or hematoma. She state she will go to [facility] if she needs further treatment.

## 2019-06-14 NOTE — Progress Notes (Signed)
HD treatment initiated at 1125. Pt is alert and oriented to person, place, time.

## 2019-06-14 NOTE — Progress Notes (Signed)
Inpatient Diabetes Program Recommendations  AACE/ADA: New Consensus Statement on Inpatient Glycemic Control (2015)  Target Ranges:  Prepandial:   less than 140 mg/dL      Peak postprandial:   less than 180 mg/dL (1-2 hours)      Critically ill patients:  140 - 180 mg/dL   Lab Results  Component Value Date   GLUCAP 297 (H) 06/14/2019   HGBA1C 8.7 (H) 06/14/2019    Review of Glycemic Control Results for Sally Lynch, Sally Lynch (MRN [id number]) as of 06/14/2019 15:17  Ref. Range 06/14/2019 07:28  Glucose-Capillary Latest Ref Range: 70 - 99 mg/dL 297 (H)   Diabetes history: DM 2 Outpatient Diabetes medications:  Lantus 30 units daily Current orders for Inpatient glycemic control: Novolog sensitive q 4 hours Inpatient Diabetes Program Recommendations:   Please consider adding Lantus 15 units q HS (1/2 of home dose).    Thanks  Adah Perl, RN, BC-ADM Inpatient Diabetes Coordinator Pager [phone] (8a-5p)

## 2019-06-15 LAB — HEPATITIS B SURFACE ANTIBODY, QUANTITATIVE: Hep B S AB Quant (Post): 37.9 m[IU]/mL (ref >9.9)

## 2019-08-13 ENCOUNTER — Emergency Department
Admission: EM | Admit: 2019-08-13 | Discharge: 2019-08-13 | Discharge status: Against Medical Advice | Payer: Medicare Other

## 2019-08-13 NOTE — ED Notes (Signed)
Security officer reports that pts husband came in and took the patient and stated that they were going to another hospital

## 2019-09-09 ENCOUNTER — Encounter (INDEPENDENT_AMBULATORY_CARE_PROVIDER_SITE_OTHER): Payer: Medicare Other

## 2019-09-09 ENCOUNTER — Ambulatory Visit (INDEPENDENT_AMBULATORY_CARE_PROVIDER_SITE_OTHER): Payer: Medicare Other | Admitting: Vascular Surgery

## 2019-10-07 ENCOUNTER — Other Ambulatory Visit: Payer: Self-pay

## 2019-10-07 ENCOUNTER — Encounter (INDEPENDENT_AMBULATORY_CARE_PROVIDER_SITE_OTHER): Payer: Self-pay | Admitting: Vascular Surgery

## 2019-10-07 ENCOUNTER — Ambulatory Visit (INDEPENDENT_AMBULATORY_CARE_PROVIDER_SITE_OTHER): Payer: Medicare Other

## 2019-10-07 ENCOUNTER — Ambulatory Visit (INDEPENDENT_AMBULATORY_CARE_PROVIDER_SITE_OTHER): Payer: Medicare Other | Admitting: Vascular Surgery

## 2019-10-07 VITALS — BP 108/66 | HR 85 | Resp 16 | Wt 187.0 lb

## 2019-10-07 DIAGNOSIS — N186 End stage renal disease: Secondary | ICD-10-CM

## 2019-10-07 DIAGNOSIS — K219 Gastro-esophageal reflux disease without esophagitis: Secondary | ICD-10-CM

## 2019-10-07 DIAGNOSIS — I10 Essential (primary) hypertension: Secondary | ICD-10-CM | POA: Diagnosis not present

## 2019-10-07 DIAGNOSIS — Z992 Dependence on renal dialysis: Secondary | ICD-10-CM

## 2019-10-07 DIAGNOSIS — T829XXS Unspecified complication of cardiac and vascular prosthetic device, implant and graft, sequela: Secondary | ICD-10-CM

## 2019-10-07 DIAGNOSIS — I25118 Atherosclerotic heart disease of native coronary artery with other forms of angina pectoris: Secondary | ICD-10-CM | POA: Diagnosis not present

## 2019-10-08 ENCOUNTER — Encounter (INDEPENDENT_AMBULATORY_CARE_PROVIDER_SITE_OTHER): Payer: Self-pay | Admitting: Vascular Surgery

## 2019-10-08 NOTE — Progress Notes (Signed)
MRN : [id number]  Sally Lynch is a 64 y.o. (1955/04/28) female who presents with chief complaint of  Chief Complaint  Patient presents with  . Follow-up    ultrasound follow up  .  History of Present Illness:   The patient returns to the office for followup of their dialysis access. The function of the access has been stable. The patient denies increased bleeding time or increased recirculation. Patient denies difficulty with cannulation. The patient denies hand pain or other symptoms consistent with steal phenomena.  No significant arm swelling.  Patient had a recent infiltration however she attributes this mostly due to having some new dialysis technicians.  Bruising is mostly resolved  The patient denies redness or swelling at the access site. The patient denies fever or chills at home or while on dialysis.  The patient denies amaurosis fugax or recent TIA symptoms. There are no recent neurological changes noted. The patient denies claudication symptoms or rest pain symptoms. The patient denies history of DVT, PE or superficial thrombophlebitis. The patient denies recent episodes of angina or shortness of breath.    Today patient had a flow volume of 1026 ml/min.  The left brachial basilic AV fistula was patent throughout with no issues chronic stenosis is not hemodynamically significant at this time.  This is compared to previous exam on 03/04/2019.  Current Meds  Medication Sig  . acetaminophen (TYLENOL) 325 MG tablet Take 650 mg by mouth every 4 (four) hours as needed for moderate pain.  . calcitRIOL (ROCALTROL) 0.25 MCG capsule Take 2.25 mcg by mouth 3 (three) times a week.   . cholecalciferol (VITAMIN D) 1000 units tablet Take 1,000 Units by mouth daily.  . cinacalcet (SENSIPAR) 30 MG tablet Take 30 mg by mouth daily. Alternating with 60 mg  . cinacalcet (SENSIPAR) 60 MG tablet Take 60 mg by mouth every other day. alternating with 30 mg  . gabapentin (NEURONTIN)  300 MG capsule Take 300-600 mg by mouth at bedtime.   . hydrOXYzine (ATARAX/VISTARIL) 25 MG tablet Take 25 mg by mouth 3 (three) times daily as needed.  . insulin glargine (LANTUS) 100 UNIT/ML injection Inject 30 Units into the skin daily.   Marland Kitchen lidocaine-prilocaine (EMLA) cream Apply topically.  Marland Kitchen LOKELMA 10 g PACK packet Take by mouth once a week.  . midodrine (PROAMATINE) 5 MG tablet Take 1 tab 1/2 hr before dialysis, take 1 tab mid treatment prn hypotension  . nitroGLYCERIN (NITROSTAT) 0.4 MG SL tablet Place 0.4 mg under the tongue every 5 (five) minutes as needed for chest pain.  . pantoprazole (PROTONIX) 40 MG tablet Take 40 mg by mouth daily.  . sodium zirconium cyclosilicate (LOKELMA) 10 g PACK packet Take by mouth.  . VELPHORO 500 MG chewable tablet Chew by mouth.    Past Medical History:  Diagnosis Date  . Anginal pain ([place])   . Arthritis   . CHF (congestive heart failure) ([place])   . Chronic kidney disease   . Coronary artery disease   . Diabetes mellitus without complication ([place])   . GERD (gastroesophageal reflux disease)   . Hypertension   . Myocardial infarction [facility])     Past Surgical History:  Procedure Laterality Date  . A/V FISTULAGRAM Left 01/06/2018   Procedure: A/V FISTULAGRAM;  Surgeon: Katha Cabal, MD;  Location: [facility];  Service: Cardiovascular;  Laterality: Left;  . AV FISTULA PLACEMENT  2011  . CARDIAC CATHETERIZATION    . EYE SURGERY  bilateral cararact surgery  . PERIPHERAL VASCULAR CATHETERIZATION N/A 07/12/2015   Procedure: A/V Shuntogram/Fistulagram;  Surgeon: Katha Cabal, MD;  Location: [facility];  Service: Cardiovascular;  Laterality: N/A;    Social History Social History   Tobacco Use  . Smoking status: Never Smoker  . Smokeless tobacco: Never Used  Substance Use Topics  . Alcohol use: No  . Drug use: No    Family History Family History  Problem Relation Age of Onset  . Hypertension Mother   .  Diabetes Mother   . Diabetes Father   . Heart attack Father     Allergies  Allergen Reactions  . Shellfish Allergy Hives  . Contrast Media [Iodinated Diagnostic Agents] Hives  . Furosemide Rash  . Other Rash    Raw fruits  . Peanut Oil Rash     REVIEW OF SYSTEMS (Negative unless checked)  Constitutional: [] Weight loss  [] Fever  [] Chills Cardiac: [] Chest pain   [] Chest pressure   [] Palpitations   [] Shortness of breath when laying flat   [] Shortness of breath with exertion. Vascular:  [] Pain in legs with walking   [] Pain in legs at rest  [] History of DVT   [] Phlebitis   [] Swelling in legs   [] Varicose veins   [] Non-healing ulcers Pulmonary:   [] Uses home oxygen   [] Productive cough   [] Hemoptysis   [] Wheeze  [] COPD   [] Asthma Neurologic:  [] Dizziness   [] Seizures   [] History of stroke   [] History of TIA  [] Aphasia   [] Vissual changes   [] Weakness or numbness in arm   [] Weakness or numbness in leg Musculoskeletal:   [] Joint swelling   [] Joint pain   [] Low back pain Hematologic:  [] Easy bruising  [] Easy bleeding   [] Hypercoagulable state   [] Anemic Gastrointestinal:  [] Diarrhea   [] Vomiting  [x] Gastroesophageal reflux/heartburn   [] Difficulty swallowing. Genitourinary:  [x] Chronic kidney disease   [] Difficult urination  [] Frequent urination   [] Blood in urine Skin:  [] Rashes   [] Ulcers  Psychological:  [] History of anxiety   []  History of major depression.  Physical Examination  Vitals:   10/07/19 1523  BP: 108/66  Pulse: 85  Resp: 16  Weight: 187 lb (84.8 kg)   Body mass index is 32.1 kg/m. Gen: WD/WN, NAD Head: [place]/AT, No temporalis wasting.  Ear/Nose/Throat: Hearing grossly intact, nares w/o erythema or drainage Eyes: PER, EOMI, sclera nonicteric.  Neck: Supple, no large masses.   Pulmonary:  Good air movement, no audible wheezing bilaterally, no use of accessory muscles.  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 No guarding/no peritoneal signs.  Musculoskeletal: M/S 5/5 throughout.  No deformity or atrophy.  Neurologic: CN 2-12 intact. Symmetrical.  Speech is fluent. Motor exam as listed above. Psychiatric: Judgment intact, Mood & affect appropriate for pt's clinical situation. Dermatologic: No rashes or ulcers noted.  No changes consistent with cellulitis.  CBC Lab Results  Component Value Date   WBC 10.1 06/14/2019   HGB 10.7 (L) 06/14/2019   HCT 34.6 (L) 06/14/2019   MCV 103.9 (H) 06/14/2019   PLT 132 (L) 06/14/2019    BMET    Component Value Date/Time   NA 139 06/14/2019 0738   K 7.3 (HH) 06/14/2019 0738   CL 98 06/14/2019 0738   CO2 25 06/14/2019 0738   GLUCOSE 315 (H) 06/14/2019 0738   BUN  61 (H) 06/14/2019 0738   CREATININE 7.38 (H) 06/14/2019 1140   CALCIUM 8.6 (L) 06/14/2019 0738   GFRNONAA 5 (L) 06/14/2019 1140   GFRAA 6 (L) 06/14/2019 1140   CrCl cannot be calculated (Patient's most recent lab result is older than the maximum 21 days allowed.).  COAG No results found for: INR, PROTIME  Radiology No results found.   Assessment/Plan 1. Complication of vascular access for dialysis, sequela Recommend:  The patient is doing well and currently has adequate dialysis access. The patient's dialysis center is not reporting any access issues. Flow pattern is stable when compared to the prior ultrasound.  The patient should have a duplex ultrasound of the dialysis access in 6 months.  The patient will follow-up with me in the office after each ultrasound   - VAS [place] (AVF, AVG); Future  2. ESRD on dialysis [facility]) At the present time the patient has adequate dialysis access.  Continue hemodialysis as ordered without interruption.  Avoid nephrotoxic medications and dehydration.  Further plans per  nephrology  3. Essential hypertension Continue antihypertensive medications as already ordered, these medications have been reviewed and there are no changes at this time.   4. Coronary artery disease of native artery of native heart with stable angina pectoris (HCC) Continue cardiac and antihypertensive medications as already ordered and reviewed, no changes at this time.  Continue statin as ordered and reviewed, no changes at this time  Nitrates PRN for chest pain   5. Gastroesophageal reflux disease without esophagitis Continue PPI as already ordered, this medication has been reviewed and there are no changes at this time.  Avoidence of caffeine and alcohol  Moderate elevation of the head of the bed    Hortencia Pilar, MD  10/08/2019 11:27 AM

## 2020-02-23 ENCOUNTER — Encounter: Payer: Self-pay | Admitting: Emergency Medicine

## 2020-02-23 ENCOUNTER — Ambulatory Visit
Admission: EM | Admit: 2020-02-23 | Discharge: 2020-02-23 | Disposition: A | Discharge status: Home / Self Care | Payer: Medicare Other | Attending: Emergency Medicine | Admitting: Emergency Medicine

## 2020-02-23 ENCOUNTER — Other Ambulatory Visit: Payer: Self-pay

## 2020-02-23 DIAGNOSIS — L03031 Cellulitis of right toe: Secondary | ICD-10-CM | POA: Diagnosis present

## 2020-02-23 MED ORDER — DOXYCYCLINE HYCLATE 100 MG PO CAPS: 100.0000 mg | ORAL_CAPSULE | Freq: Two times a day (BID) | ORAL | 0 refills | Status: AC

## 2020-02-23 NOTE — Discharge Instructions (Addendum)
Take the doxycycline twice a day for 10 days.  If you develop a yeast infection use over-the-counter Monistat treatment.  Soak your foot in warm water and Epson salts twice daily to help facilitate drainage.  Keep your foot open to the air as much as possible.  If you have to go out, such as when you go to dialysis, cover the area with a Band-Aid.  If your symptoms continue or worsen follow-up with your podiatrist.

## 2020-02-23 NOTE — ED Provider Notes (Signed)
[facility]    CSN: [id number] Arrival date & time: 02/23/20  1057      History   Chief Complaint Chief Complaint  Patient presents with  . Toe Pain    right    HPI Sally Lynch is a 64 y.o. female.   HPI   64 year old female here for evaluation of right toe pain and swelling.  Patient reports that she first noticed a problem last night when she was in the shower.  She states that her toe has started to drain a milky discharge.  She states that all week her blood sugar is running over 150 when she gets up in the morning as fasting.  Patient denies fevers.  Patient states she has had multiple nail infections in the past on the same foot and same toe.  Past Medical History:  Diagnosis Date  . Anginal pain ([place])   . Arthritis   . CHF (congestive heart failure) ([place])   . Chronic kidney disease   . Coronary artery disease   . Diabetes mellitus without complication ([place])   . GERD (gastroesophageal reflux disease)   . Hypertension   . Myocardial infarction [facility])     Patient Active Problem List   Diagnosis Date Noted  . Hyperkalemia 06/14/2019  . 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 Dizziness 06/14/2019  . Chronic kidney disease (CKD) 03/04/2019  . Coronary artery disease 03/04/2019  . GERD (gastroesophageal reflux disease) 08/01/2017  . Left medial tibial plateau fracture 04/19/2016  . ESRD on dialysis ([place]) 01/18/2016  . Complication of vascular access for dialysis 01/18/2016  . Essential hypertension 01/18/2016  . Type 2 diabetes mellitus treated with insulin ([place]) 01/18/2016  . Hyperlipidemia 01/18/2016  . Type 2 diabetes mellitus with hyperglycemia ([place]) 07/24/2015  . Allergic rhinitis 12/08/2013  . Nasal obstruction 12/08/2013  . Patient awaiting renal transplant 11/12/2013  . Organ transplant candidate 11/12/2013  . Heart disease 08/16/2013  . Left ventricular dysfunction 08/16/2013  . Disorder of kidney  and ureter 06/24/2012  . Epiretinal membrane 06/11/2012  . Vitamin D deficiency 06/30/2006  . Absence of both cervix and uterus, acquired 04/06/2006  . H/O hysterectomy for benign disease 04/06/2006  . Chronic ischemic heart disease 10/24/2004  . Heart failure, unspecified ([place]) 10/01/2004    Past Surgical History:  Procedure Laterality Date  . A/V FISTULAGRAM Left 01/06/2018   Procedure: A/V FISTULAGRAM;  Surgeon: Katha Cabal, MD;  Location: [place] [facility];  Service: Cardiovascular;  Laterality: Left;  . AV FISTULA PLACEMENT  2011  . CARDIAC CATHETERIZATION    . EYE SURGERY     bilateral cararact surgery  . PERIPHERAL VASCULAR CATHETERIZATION N/A 07/12/2015   Procedure: A/V Shuntogram/Fistulagram;  Surgeon: Katha Cabal, MD;  Location: [place] [facility];  Service: Cardiovascular;  Laterality: N/A;    OB History   No obstetric history on file.      Home Medications    Prior to Admission medications   Medication Sig Start Date End Date Taking? Authorizing Provider  acetaminophen (TYLENOL) 325 MG tablet Take 650 mg by mouth every 4 (four) hours as needed for moderate pain.   Yes [provider]  aspirin 81 MG tablet Take 81 mg by mouth daily.    Yes [provider]  atorvastatin (LIPITOR) 80 MG tablet Take 80 mg by mouth daily.    Yes [provider]  calcitRIOL (ROCALTROL) 0.25 MCG capsule Take 2.25 mcg by mouth 3 (  three) times a week.  10/20/17  Yes [provider]  cholecalciferol (VITAMIN D) 1000 units tablet Take 1,000 Units by mouth daily.   Yes [provider]  cinacalcet (SENSIPAR) 30 MG tablet Take 30 mg by mouth daily. Alternating with 60 mg   Yes [provider]  cinacalcet (SENSIPAR) 60 MG tablet Take 60 mg by mouth every other day. alternating with 30 mg   Yes [provider]  clopidogrel (PLAVIX) 75 MG tablet Take 75 mg by mouth daily.    Yes [provider]  gabapentin  (NEURONTIN) 300 MG capsule Take 300-600 mg by mouth at bedtime.    Yes [provider]  hydrOXYzine (ATARAX/VISTARIL) 25 MG tablet Take 25 mg by mouth 3 (three) times daily as needed.   Yes [provider]  insulin glargine (LANTUS) 100 UNIT/ML injection Inject 30 Units into the skin daily.    Yes [provider]  lidocaine-prilocaine (EMLA) cream Apply topically. 07/19/17  Yes [provider]  LOKELMA 10 g PACK packet Take by mouth once a week. 09/27/19  Yes [provider]  metoprolol succinate (TOPROL-XL) 25 MG 24 hr tablet Take 25 mg by mouth. Non-dialysis days   Yes [provider]  midodrine (PROAMATINE) 5 MG tablet Take 1 tab 1/2 hr before dialysis, take 1 tab mid treatment prn hypotension 05/22/16  Yes [provider]  nitroGLYCERIN (NITROSTAT) 0.4 MG SL tablet Place 0.4 mg under the tongue every 5 (five) minutes as needed for chest pain.   Yes [provider]  pantoprazole (PROTONIX) 40 MG tablet Take 40 mg by mouth daily.   Yes [provider]  sevelamer carbonate (RENVELA) 800 MG tablet Take 4,000 mg by mouth 3 (three) times daily.  09/10/13  Yes [provider]  sodium polystyrene (KAYEXALATE) 15 GM/60ML suspension Take 20 g by mouth every other day.    Yes [provider]  sodium zirconium cyclosilicate (LOKELMA) 10 g PACK packet Take by mouth.   Yes [provider]  triamcinolone cream (KENALOG) 0.1 % Mix 1:1 w Eucerin cream.  Apply to dry itchy skin twice daily prn 10/26/13  Yes [provider]  VELPHORO 500 MG chewable tablet Chew by mouth. 08/30/19  Yes [provider]  doxycycline (VIBRAMYCIN) 100 MG capsule Take 1 capsule (100 mg total) by mouth 2 (two) times daily. 02/23/20   Margarette Canada, NP    Family History Family History  Problem Relation Age of Onset  . Hypertension Mother   . Diabetes Mother   . Diabetes Father   . Heart attack Father     Social  History Social History   Tobacco Use  . Smoking status: Never Smoker  . Smokeless tobacco: Never Used  Substance Use Topics  . Alcohol use: No  . Drug use: No     Allergies   Shellfish allergy, Contrast media [iodinated diagnostic agents], Furosemide, Other, and Peanut oil   Review of Systems Review of Systems  Constitutional: Negative for fever.  Musculoskeletal: Positive for arthralgias.  Skin: Positive for color change.  Hematological: Negative.   Psychiatric/Behavioral: Negative.      Physical Exam Triage Vital Signs ED Triage Vitals  Enc Vitals Group     BP 02/23/20 1129 120/65     Pulse Rate 02/23/20 1129 88     Resp 02/23/20 1129 18     Temp 02/23/20 1129 98 F (36.7 C)     Temp Source 02/23/20 1129 Oral  SpO2 02/23/20 1129 97 %     Weight 02/23/20 1128 187 lb 6.3 oz (85 kg)     Height 02/23/20 1128 5\' 4"  (1.626 m)     Head Circumference --      Peak Flow --      Pain Score 02/23/20 1127 7     Pain Loc --      Pain Edu? --      Excl. in [place]? --    No data found.  Updated Vital Signs BP 120/65 (BP Location: Right Arm)   Pulse 88   Temp 98 F (36.7 C) (Oral)   Resp 18   Ht 5\' 4"  (1.626 m)   Wt 187 lb 6.3 oz (85 kg)   SpO2 97%   BMI 32.17 kg/m   Visual Acuity Right Eye Distance:   Left Eye Distance:   Bilateral Distance:    Right Eye Near:   Left Eye Near:    Bilateral Near:     Physical Exam Vitals and nursing note reviewed.  Constitutional:      General: She is not in acute distress.    Appearance: Normal appearance. She is not toxic-appearing.  HENT:     Head: Normocephalic and atraumatic.  Musculoskeletal:        General: Swelling and tenderness present.  Skin:    General: Skin is warm and dry.     Capillary Refill: Capillary refill takes less than 2 seconds.     Findings: Erythema and lesion present.  Neurological:     General: No focal deficit present.     Mental Status: She is alert and oriented to person, place, and  time.  Psychiatric:        Mood and Affect: Mood normal.        Behavior: Behavior normal.        Thought Content: Thought content normal.        Judgment: Judgment normal.      UC Treatments / Results  Labs (all labs ordered are listed, but only abnormal results are displayed) Labs Reviewed  AEROBIC CULTURE (SUPERFICIAL SPECIMEN)    EKG   Radiology No results found.  Procedures Procedures (including critical care time)  Medications Ordered in UC Medications - No data to display  Initial Impression / Assessment and Plan / UC Course  I have reviewed the triage vital signs and the nursing notes.  Pertinent labs & imaging results that were available during my care of the patient were reviewed by me and considered in my medical decision making (see chart for details).   Evaluation of possible toe infection on her right great toe.  She states she first noticed symptoms last night when she was in the shower.  Patient has redness to her right great toe and distal midfoot.  There is fluctuance to the superior and medial aspect of the right great toe.  It appears that there was a fluid collection that has ruptured and is draining out around the medial edge of the right great toenail.  The drainage is milky serous in color.  Sample collected for culture.  Exam is consistent with a paronychia.  Will place patient on doxycycline twice daily x10 days, have patient perform Epson salt soaks twice daily, keep the area open to the air.  Patient sees podiatry and I will have her follow-up with her podiatrist if her symptoms continue.  Patient is also concerned about developing a possible yeast infection.  Use over-the-counter Monistat if she  develops a yeast infection.   Final Clinical Impressions(s) / UC Diagnoses   Final diagnoses:  Paronychia of great toe of right foot     Discharge Instructions     Take the doxycycline twice a day for 10 days.  If you develop a yeast infection use  over-the-counter Monistat treatment.  Soak your foot in warm water and Epson salts twice daily to help facilitate drainage.  Keep your foot open to the air as much as possible.  If you have to go out, such as when you go to dialysis, cover the area with a Band-Aid.  If your symptoms continue or worsen follow-up with your podiatrist.    ED Prescriptions    Medication Sig Dispense Auth. Provider   doxycycline (VIBRAMYCIN) 100 MG capsule Take 1 capsule (100 mg total) by mouth 2 (two) times daily. 20 capsule Margarette Canada, NP     PDMP not reviewed this encounter.   Margarette Canada, NP 02/23/20 1241

## 2020-02-23 NOTE — ED Triage Notes (Signed)
Patient c/o right great toe pain and swelling that started yesterday.

## 2020-02-26 LAB — AEROBIC CULTURE W GRAM STAIN (SUPERFICIAL SPECIMEN)

## 2020-04-06 ENCOUNTER — Encounter (INDEPENDENT_AMBULATORY_CARE_PROVIDER_SITE_OTHER): Payer: Self-pay | Admitting: Vascular Surgery

## 2020-04-06 ENCOUNTER — Ambulatory Visit (INDEPENDENT_AMBULATORY_CARE_PROVIDER_SITE_OTHER): Payer: Medicare Other

## 2020-04-06 ENCOUNTER — Ambulatory Visit (INDEPENDENT_AMBULATORY_CARE_PROVIDER_SITE_OTHER): Payer: Medicare Other | Admitting: Vascular Surgery

## 2020-04-06 ENCOUNTER — Other Ambulatory Visit: Payer: Self-pay

## 2020-04-06 VITALS — BP 126/68 | HR 87 | Ht 64.0 in | Wt 186.0 lb

## 2020-04-06 DIAGNOSIS — I10 Essential (primary) hypertension: Secondary | ICD-10-CM

## 2020-04-06 DIAGNOSIS — N186 End stage renal disease: Secondary | ICD-10-CM

## 2020-04-06 DIAGNOSIS — T829XXS Unspecified complication of cardiac and vascular prosthetic device, implant and graft, sequela: Secondary | ICD-10-CM

## 2020-04-06 DIAGNOSIS — I7025 Atherosclerosis of native arteries of other extremities with ulceration: Secondary | ICD-10-CM

## 2020-04-06 DIAGNOSIS — I25118 Atherosclerotic heart disease of native coronary artery with other forms of angina pectoris: Secondary | ICD-10-CM | POA: Diagnosis not present

## 2020-04-06 DIAGNOSIS — Z992 Dependence on renal dialysis: Secondary | ICD-10-CM

## 2020-04-06 DIAGNOSIS — Z794 Long term (current) use of insulin: Secondary | ICD-10-CM

## 2020-04-06 DIAGNOSIS — E119 Type 2 diabetes mellitus without complications: Secondary | ICD-10-CM

## 2020-04-06 NOTE — Progress Notes (Signed)
MRN : [id number]  Sally Lynch is a 65 y.o. (1955/11/06) female who presents with chief complaint of  Chief Complaint  Patient presents with  . Follow-up    6 Mo   .  History of Present Illness:   The patient returns to the office for followup of their dialysis access. The function of the access has been stable. The patient denies increased bleeding time or increased recirculation. Patient denies difficulty with cannulation but she is concerned about the skin over the aneurysmal areas. The patient denies hand pain or other symptoms consistent with steal phenomena. No significant arm swelling.  The patient denies redness or swelling at the access site. The patient denies fever or chills at home or while on dialysis.  As an interval development.  Early in December she developed infection of her right great toe.  This was drained at urgent care and she received 10 days of antibiotics.  She has followed up with Dr. Vickki Muff.  At this point her toe is stable and she feels it is markedly improved when compared to the photo that she has in her phone shortly after incision and drainage.  She denies pain at the site.  She does have some diabetic neuropathy.  No history of claudication symptoms.  No history of past vascular interventions.  The patient denies amaurosis fugax or recent TIA symptoms. There are no recent neurological changes noted. The patient denies claudication symptoms or rest pain symptoms. The patient denies history of DVT, PE or superficial thrombophlebitis. The patient denies recent episodes of angina or shortness of breath.  Today patient had a flow volume of 962 ml/min. The left brachial basilic AV fistula was patent throughout with no issues chronic stenosis is not hemodynamically significant at this time. (flow volume of 1026 ml/min).  This is compared to previous exam on 10/07/2019.  Current Meds  Medication Sig  . acetaminophen (TYLENOL) 325 MG tablet Take 650 mg  by mouth every 4 (four) hours as needed for moderate pain.  Marland Kitchen aspirin 81 MG tablet Take 81 mg by mouth daily.   Marland Kitchen atorvastatin (LIPITOR) 80 MG tablet Take 80 mg by mouth daily.   . calcitRIOL (ROCALTROL) 0.25 MCG capsule Take 2.25 mcg by mouth 3 (three) times a week.   . cholecalciferol (VITAMIN D) 1000 units tablet Take 1,000 Units by mouth daily.  . cinacalcet (SENSIPAR) 30 MG tablet Take 30 mg by mouth daily. Alternating with 60 mg  . cinacalcet (SENSIPAR) 60 MG tablet Take 60 mg by mouth every other day. alternating with 30 mg  . clopidogrel (PLAVIX) 75 MG tablet Take 75 mg by mouth daily.   Marland Kitchen doxycycline (VIBRAMYCIN) 100 MG capsule Take 1 capsule (100 mg total) by mouth 2 (two) times daily.  Marland Kitchen gabapentin (NEURONTIN) 300 MG capsule Take 300-600 mg by mouth at bedtime.   . hydrOXYzine (ATARAX/VISTARIL) 25 MG tablet Take 25 mg by mouth 3 (three) times daily as needed.  . insulin glargine (LANTUS) 100 UNIT/ML injection Inject 30 Units into the skin daily.   . Insulin Pen Needle (B-D UF III MINI PEN NEEDLES) 31G X 5 MM MISC Inject into the skin.  Marland Kitchen lidocaine-prilocaine (EMLA) cream Apply topically.  Marland Kitchen LOKELMA 10 g PACK packet Take by mouth once a week.  . metoprolol succinate (TOPROL-XL) 25 MG 24 hr tablet Take 25 mg by mouth. Non-dialysis days  . midodrine (PROAMATINE) 5 MG tablet Take 1 tab 1/2 hr before dialysis, take 1 tab mid treatment  prn hypotension  . moxifloxacin (VIGAMOX) 0.5 % ophthalmic solution Apply to eye.  . mupirocin ointment (BACTROBAN) 2 % Apply topically.  . nitroGLYCERIN (NITROSTAT) 0.4 MG SL tablet Place 0.4 mg under the tongue every 5 (five) minutes as needed for chest pain.  . pantoprazole (PROTONIX) 40 MG tablet Take 40 mg by mouth daily.  . sevelamer carbonate (RENVELA) 800 MG tablet Take 4,000 mg by mouth 3 (three) times daily.   . sodium polystyrene (KAYEXALATE) 15 GM/60ML suspension Take 20 g by mouth every other day.   . sodium zirconium cyclosilicate (LOKELMA) 10  g PACK packet Take by mouth.  . triamcinolone cream (KENALOG) 0.1 % Mix 1:1 w Eucerin cream.  Apply to dry itchy skin twice daily prn  . VELPHORO 500 MG chewable tablet Chew by mouth.    Past Medical History:  Diagnosis Date  . Anginal pain ([place])   . Arthritis   . CHF (congestive heart failure) ([place])   . Chronic kidney disease   . Coronary artery disease   . Diabetes mellitus without complication ([place])   . GERD (gastroesophageal reflux disease)   . Hypertension   . Myocardial infarction [facility])     Past Surgical History:  Procedure Laterality Date  . A/V FISTULAGRAM Left 01/06/2018   Procedure: A/V FISTULAGRAM;  Surgeon: Katha Cabal, MD;  Location: [place] [facility];  Service: Cardiovascular;  Laterality: Left;  . AV FISTULA PLACEMENT  2011  . CARDIAC CATHETERIZATION    . EYE SURGERY     bilateral cararact surgery  . PERIPHERAL VASCULAR CATHETERIZATION N/A 07/12/2015   Procedure: A/V Shuntogram/Fistulagram;  Surgeon: Katha Cabal, MD;  Location: [place] [facility];  Service: Cardiovascular;  Laterality: N/A;    Social History Social History   Tobacco Use  . Smoking status: Never Smoker  . Smokeless tobacco: Never Used  Substance Use Topics  . Alcohol use: No  . Drug use: No    Family History Family History  Problem Relation Age of Onset  . Hypertension Mother   . Diabetes Mother   . Diabetes Father   . Heart attack Father     Allergies  Allergen Reactions  . Shellfish Allergy Hives  . Contrast Media [Iodinated Diagnostic Agents] Hives  . Furosemide Rash  . Other Rash    Raw fruits  . Peanut Oil Rash     REVIEW OF SYSTEMS (Negative unless checked)  Constitutional: '[]'$ Weight loss  '[]'$ Fever  '[]'$ Chills Cardiac: '[]'$ Chest pain   '[]'$ Chest pressure   '[]'$ Palpitations   '[]'$ Shortness of breath when laying flat   '[]'$ Shortness of breath with exertion. Vascular:  '[]'$ Pain in legs with walking   '[]'$ Pain in legs at rest  '[]'$ History of DVT   '[]'$ Phlebitis   '[]'$ Swelling  in legs   '[]'$ Varicose veins   '[x]'$ Non-healing ulcers Pulmonary:   '[]'$ Uses home oxygen   '[]'$ Productive cough   '[]'$ Hemoptysis   '[]'$ Wheeze  '[]'$ COPD   '[]'$ Asthma Neurologic:  '[]'$ Dizziness   '[]'$ Seizures   '[]'$ History of stroke   '[]'$ History of TIA  '[]'$ Aphasia   '[]'$ Vissual changes   '[]'$ Weakness or numbness in arm   '[]'$ Weakness or numbness in leg Musculoskeletal:   '[]'$ Joint swelling   '[]'$ Joint pain   '[]'$ Low back pain Hematologic:  '[]'$ Easy bruising  '[]'$ Easy bleeding   '[]'$ Hypercoagulable state   '[]'$ Anemic Gastrointestinal:  '[]'$ Diarrhea   '[]'$ Vomiting  '[]'$ Gastroesophageal reflux/heartburn   '[]'$ Difficulty swallowing. Genitourinary:  '[x]'$ Chronic kidney disease   '[]'$ Difficult urination  '[]'$ Frequent urination   '[]'$ Blood in urine Skin:  '[]'$ Rashes   '[]'$   Ulcers  Psychological:  '[]'$ History of anxiety   '[]'$  History of major depression.  Physical Examination  Vitals:   04/06/20 0958  BP: 126/68  Pulse: 87  Weight: 186 lb (84.4 kg)  Height: '5\' 4"'$  (1.626 m)   Body mass index is 31.93 kg/m. Gen: WD/WN, NAD Head: [place]/AT, No temporalis wasting.  Ear/Nose/Throat: Hearing grossly intact, nares w/o erythema or drainage Eyes: PER, EOMI, sclera nonicteric.  Neck: Supple, no large masses.   Pulmonary:  Good air movement, no audible wheezing bilaterally, no use of accessory muscles.  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  The skin overlying the aneurysms is depigmented and thin.  The fistula itself has an excellent thrill and bruit.  With respect to the right great toe there is a medial ulcer noted which appears dry the skin surrounding the ulcer appears to be intact no evidence of purulence. Vessel Right Left  Radial Palpable Palpable  Brachial Palpable Palpable  Popliteal Not Palpable Not Palpable  PT Not Palpable Not Palpable  DP Not Palpable Not Palpable  Gastrointestinal: Non-distended. No guarding/no peritoneal signs.  Musculoskeletal: M/S 5/5 throughout.  No deformity or atrophy.   Neurologic: CN 2-12 intact. Symmetrical.  Speech is fluent. Motor exam as listed above. Psychiatric: Judgment intact, Mood & affect appropriate for pt's clinical situation. Dermatologic: No rashes + ulcer noted right great toe.  No changes consistent with cellulitis.   CBC Lab Results  Component Value Date   WBC 10.1 06/14/2019   HGB 10.7 (L) 06/14/2019   HCT 34.6 (L) 06/14/2019   MCV 103.9 (H) 06/14/2019   PLT 132 (L) 06/14/2019    BMET    Component Value Date/Time   NA 139 06/14/2019 0738   K 7.3 (HH) 06/14/2019 0738   CL 98 06/14/2019 0738   CO2 25 06/14/2019 0738   GLUCOSE 315 (H) 06/14/2019 0738   BUN 61 (H) 06/14/2019 0738   CREATININE 7.38 (H) 06/14/2019 1140   CALCIUM 8.6 (L) 06/14/2019 0738   GFRNONAA 5 (L) 06/14/2019 1140   GFRAA 6 (L) 06/14/2019 1140   CrCl cannot be calculated (Patient's most recent lab result is older than the maximum 21 days allowed.).  COAG No results found for: INR, PROTIME  Radiology No results found.   Assessment/Plan 1. Complication of vascular access for dialysis, sequela Recommend:  The patient is doing well and currently has adequate dialysis access. The patient's dialysis center is not reporting any access issues. Flow pattern is stable when compared to the prior ultrasound.  However, clearly her aneurysms are an issue.  I have discussed with her the plan of performing a fistulogram to assess both the inflow as well as the outflow and then if both are okay resecting the aneurysmal segment and replacing with an Artegraft.  I do not believe that this is required at this time but I believe we are getting close to the revision.  I have asked her to tell dialysis to access her fistula in the areas where the skin has not become deep pigmented and thinned.  I did mention if they are unable to do this that would drive the revision.  The patient should have a duplex ultrasound of the dialysis access in 6 months. The patient will  follow-up with me in the office after each ultrasound     2. Atherosclerosis of native arteries of the extremities with ulceration [facility]) Patient is developed a wound in the right great toe.  Her  infection has resolved and it appears to be improved over the photos that she has in her phone.  However, the remaining ulcer does appear somewhat desiccated and I am uncertain as to whether she has adequate arterial perfusion to completely heal this wound.  I will see her back in the next couple weeks with duplex ultrasound of the right lower extremity as well as ABIs this will also allow me to assess whether there has been any progression of wound healing. - VAS [place] LOWER EXTREMITY ARTERIAL DUPLEX; Future - VAS [place] ABI WITH/WO TBI; Future  3. Coronary artery disease of native artery of native heart with stable angina pectoris (HCC) Continue cardiac and antihypertensive medications as already ordered and reviewed, no changes at this time.  Continue statin as ordered and reviewed, no changes at this time  Nitrates PRN for chest pain   4. Essential hypertension Continue antihypertensive medications as already ordered, these medications have been reviewed and there are no changes at this time.   5. Type 2 diabetes mellitus treated with insulin ([place]) Continue hypoglycemic medications as already ordered, these medications have been reviewed and there are no changes at this time.  Hgb A1C to be monitored as already arranged by primary service   6. ESRD on dialysis [facility]) At the present time the patient has adequate dialysis access.  Continue hemodialysis as ordered without interruption.  Avoid nephrotoxic medications and dehydration.  Further plans per nephrology  Hortencia Pilar, MD  04/06/2020 10:29 AM

## 2020-04-27 ENCOUNTER — Ambulatory Visit (INDEPENDENT_AMBULATORY_CARE_PROVIDER_SITE_OTHER): Payer: Medicare Other

## 2020-04-27 ENCOUNTER — Encounter (INDEPENDENT_AMBULATORY_CARE_PROVIDER_SITE_OTHER): Payer: Self-pay | Admitting: Vascular Surgery

## 2020-04-27 ENCOUNTER — Ambulatory Visit (INDEPENDENT_AMBULATORY_CARE_PROVIDER_SITE_OTHER): Payer: Medicare Other | Admitting: Vascular Surgery

## 2020-04-27 ENCOUNTER — Other Ambulatory Visit: Payer: Self-pay

## 2020-04-27 VITALS — BP 111/68 | HR 84 | Resp 16 | Wt 188.0 lb

## 2020-04-27 DIAGNOSIS — T829XXS Unspecified complication of cardiac and vascular prosthetic device, implant and graft, sequela: Secondary | ICD-10-CM | POA: Diagnosis not present

## 2020-04-27 DIAGNOSIS — I10 Essential (primary) hypertension: Secondary | ICD-10-CM

## 2020-04-27 DIAGNOSIS — I7025 Atherosclerosis of native arteries of other extremities with ulceration: Secondary | ICD-10-CM

## 2020-04-27 DIAGNOSIS — N186 End stage renal disease: Secondary | ICD-10-CM | POA: Diagnosis not present

## 2020-04-27 DIAGNOSIS — I25118 Atherosclerotic heart disease of native coronary artery with other forms of angina pectoris: Secondary | ICD-10-CM | POA: Diagnosis not present

## 2020-04-27 DIAGNOSIS — Z794 Long term (current) use of insulin: Secondary | ICD-10-CM

## 2020-04-27 DIAGNOSIS — E119 Type 2 diabetes mellitus without complications: Secondary | ICD-10-CM

## 2020-04-27 DIAGNOSIS — Z992 Dependence on renal dialysis: Secondary | ICD-10-CM

## 2020-04-27 MED ORDER — MUPIROCIN 2 % EX OINT: TOPICAL_OINTMENT | CUTANEOUS | 0 refills | Status: AC

## 2020-04-27 NOTE — Progress Notes (Signed)
MRN : [id number]  Sally Lynch is a 65 y.o. (Jun 08, 1955) female who presents with chief complaint of No chief complaint on file. Marland Kitchen  History of Present Illness:   The patient returns to the office for followup and review of the noninvasive studies. There has been a significant deterioration in the lower extremity symptoms.  The patient notes interval shortening of their claudication distance and development of mild rest pain symptoms. No new ulcers or wounds have occurred since the last visit.  She feels very strongly that her wound is healing  There have been no significant changes to the patient's overall health care.  The patient denies amaurosis fugax or recent TIA symptoms. There are no recent neurological changes noted. The patient denies history of DVT, PE or superficial thrombophlebitis. The patient denies recent episodes of angina or shortness of breath.   ABI's Rt=1.07 and Lt=1.00 (both sides are noncompressible) Duplex US of the right lower extremity arterial system shows the SFA remains patent  Previous access duplex had a flow volume of962 ml/min. The left brachial basilic AV fistula was patent throughout with no issueschronic stenosis is not hemodynamically significant at this time. (flow volume of1026 ml/min).  This is compared to previous exam on7/22/2021.   No outpatient medications have been marked as taking for the 04/27/20 encounter (Appointment) with Delana Meyer, Dolores Lory, MD.    Past Medical History:  Diagnosis Date  . Anginal pain ([place])   . Arthritis   . CHF (congestive heart failure) ([place])   . Chronic kidney disease   . Coronary artery disease   . Diabetes mellitus without complication ([place])   . GERD (gastroesophageal reflux disease)   . Hypertension   . Myocardial infarction [facility])     Past Surgical History:  Procedure Laterality Date  . A/V FISTULAGRAM Left 01/06/2018   Procedure: A/V FISTULAGRAM;  Surgeon: Katha Cabal, MD;  Location:  [place] [facility];  Service: Cardiovascular;  Laterality: Left;  . AV FISTULA PLACEMENT  2011  . CARDIAC CATHETERIZATION    . EYE SURGERY     bilateral cararact surgery  . PERIPHERAL VASCULAR CATHETERIZATION N/A 07/12/2015   Procedure: A/V Shuntogram/Fistulagram;  Surgeon: Katha Cabal, MD;  Location: [place] [facility];  Service: Cardiovascular;  Laterality: N/A;    Social History Social History   Tobacco Use  . Smoking status: Never Smoker  . Smokeless tobacco: Never Used  Substance Use Topics  . Alcohol use: No  . Drug use: No    Family History Family History  Problem Relation Age of Onset  . Hypertension Mother   . Diabetes Mother   . Diabetes Father   . Heart attack Father     Allergies  Allergen Reactions  . Shellfish Allergy Hives  . Contrast Media [Iodinated Diagnostic Agents] Hives  . Furosemide Rash  . Other Rash    Raw fruits  . Peanut Oil Rash     REVIEW OF SYSTEMS (Negative unless checked)  Constitutional: '[]'$ Weight loss  '[]'$ Fever  '[]'$ Chills Cardiac: '[]'$ Chest pain   '[]'$ Chest pressure   '[]'$ Palpitations   '[]'$ Shortness of breath when laying flat   '[]'$ Shortness of breath with exertion. Vascular:  '[]'$ Pain in legs with walking   '[]'$ Pain in legs at rest  '[]'$ History of DVT   '[]'$ Phlebitis   '[]'$ Swelling in legs   '[]'$ Varicose veins   '[]'$ Non-healing ulcers Pulmonary:   '[]'$ Uses home oxygen   '[]'$ Productive cough   '[]'$ Hemoptysis   '[]'$ Wheeze  '[]'$ COPD   '[]'$ Asthma Neurologic:  '[]'$   Dizziness   '[]'$ Seizures   '[]'$ History of stroke   '[]'$ History of TIA  '[]'$ Aphasia   '[]'$ Vissual changes   '[]'$ Weakness or numbness in arm   '[]'$ Weakness or numbness in leg Musculoskeletal:   '[]'$ Joint swelling   '[]'$ Joint pain   '[]'$ Low back pain Hematologic:  '[]'$ Easy bruising  '[]'$ Easy bleeding   '[]'$ Hypercoagulable state   '[]'$ Anemic Gastrointestinal:  '[]'$ Diarrhea   '[]'$ Vomiting  '[]'$ Gastroesophageal reflux/heartburn   '[]'$ Difficulty swallowing. Genitourinary:  '[x]'$ Chronic kidney disease   '[]'$ Difficult urination  '[]'$ Frequent urination    '[]'$ Blood in urine Skin:  '[]'$ Rashes   '[]'$ Ulcers  Psychological:  '[]'$ History of anxiety   '[]'$  History of major depression.  Physical Examination  There were no vitals filed for this visit. There is no height or weight on file to calculate BMI. Gen: WD/WN, NAD Head: [place]/AT, No temporalis wasting.  Ear/Nose/Throat: Hearing grossly intact, nares w/o erythema or drainage Eyes: PER, EOMI, sclera nonicteric.  Neck: Supple, no large masses.   Pulmonary:  Good air movement, no audible wheezing bilaterally, no use of accessory muscles.  Cardiac: RRR, no JVD Vascular:  Right toe ulcer is CD&I appears superficial Vessel Right Left  Radial Palpable Palpable  PT Not Palpable Not Palpable  DP notPalpable Not Palpable  Gastrointestinal: Non-distended. No guarding/no peritoneal signs.  Musculoskeletal: M/S 5/5 throughout.  No deformity or atrophy.  Neurologic: CN 2-12 intact. Symmetrical.  Speech is fluent. Motor exam as listed above. Psychiatric: Judgment intact, Mood & affect appropriate for pt's clinical situation. Dermatologic: No rashes or ulcers noted.  No changes consistent with cellulitis.  CBC Lab Results  Component Value Date   WBC 10.1 06/14/2019   HGB 10.7 (L) 06/14/2019   HCT 34.6 (L) 06/14/2019   MCV 103.9 (H) 06/14/2019   PLT 132 (L) 06/14/2019    BMET    Component Value Date/Time   NA 139 06/14/2019 0738   K 7.3 (HH) 06/14/2019 0738   CL 98 06/14/2019 0738   CO2 25 06/14/2019 0738   GLUCOSE 315 (H) 06/14/2019 0738   BUN 61 (H) 06/14/2019 0738   CREATININE 7.38 (H) 06/14/2019 1140   CALCIUM 8.6 (L) 06/14/2019 0738   GFRNONAA 5 (L) 06/14/2019 1140   GFRAA 6 (L) 06/14/2019 1140   CrCl cannot be calculated (Patient's most recent lab result is older than the maximum 21 days allowed.).  COAG No results found for: INR, PROTIME  Radiology VAS [place] (AVF, AVG)  Result Date: 04/11/2020 DIALYSIS ACCESS Reason for Exam: Routine follow up. Access Site: Left  Upper Extremity. Access Type: Basilic vein transposition. History: Brachial-basilic trans Created at [facility] 2013;          2015; Banding of AVF in distal UA;          07/12/2015; PTA and stent placement;          [id number]: Left basilic vein PTA;. Comparison Study: 10/07/2019 Performing Technologist: Concha Norway RVT  Examination Guidelines: A complete evaluation includes B-mode imaging, spectral Doppler, color Doppler, and power Doppler as needed of all accessible portions of each vessel. Unilateral testing is considered an integral part of a complete examination. Limited examinations for reoccurring indications may be performed as noted.  Findings: +--------------------+----------+-----------------+--------+ AVF                 PSV (cm/s)Flow Vol (mL/min)Comments +--------------------+----------+-----------------+--------+ Native artery inflow    63           962                +--------------------+----------+-----------------+--------+  AVF Anastomosis        332                      .27cm   +--------------------+----------+-----------------+--------+  +---------------+----------+-------------+----------+--------+ OUTFLOW VEIN   PSV (cm/s)Diameter (cm)Depth (cm)Describe +---------------+----------+-------------+----------+--------+ Subclavian vein    36                                    +---------------+----------+-------------+----------+--------+ Confluence        141                                    +---------------+----------+-------------+----------+--------+ Prox UA            92                                    +---------------+----------+-------------+----------+--------+ Mid UA             80                                    +---------------+----------+-------------+----------+--------+ Dist UA            73        1.89                        +---------------+----------+-------------+----------+--------+   +---------------+------------+----------+---------+--------+------------------+                  Diameter  Depth (cm)Branching  PSV      Flow Volume                        (cm)                        (cm/s)      (ml/min)      +---------------+------------+----------+---------+--------+------------------+ Left Rad Art                                     38                      Dis                                                                      +---------------+------------+----------+---------+--------+------------------+ Antegrade                                                                +---------------+------------+----------+---------+--------+------------------+  Summary: Patent left brachiobasilic trans AVF no significant stenosis or changes from previous study.  *See table(s) above for measurements and observations.  Diagnosing physician: Hortencia Pilar MD Electronically  signed by Hortencia Pilar MD on 04/11/2020 at 9:44:35 AM.    --------------------------------------------------------------------------------   Final     Assessment/Plan 1. Atherosclerosis of native arteries of the extremities with ulceration ([place])  Recommend:  The patient has evidence of atherosclerosis of the lower extremities with claudication.  The patient does not voice lifestyle limiting changes at this point in time.  She feels that the wound is healing so it is hard to encourage her to move forward with an angiogram.  I will see her in one month and if we are still not healed then angiogram will be recommended.  Noninvasive studies do not suggest clinically significant change.  No invasive studies, angiography or surgery at this time The patient should continue walking and begin a more formal exercise program.  The patient should continue antiplatelet therapy and aggressive treatment of the lipid abnormalities  No changes in the patient's medications at this time  The  patient should continue wearing graduated compression socks 10-15 mmHg strength to control the mild edema.    2. Complication of vascular access for dialysis, sequela Recommend:  The patient is doing well and currently has adequate dialysis access. The patient's dialysis center is not reporting any access issues. Flow pattern is stable when compared to the prior ultrasound.  The patient should have a duplex ultrasound of the dialysis access in 6 months. The patient will follow-up with me in the office after each ultrasound     3. ESRD on dialysis [facility]) At the present time the patient has adequate dialysis access.  Continue hemodialysis as ordered without interruption.  Avoid nephrotoxic medications and dehydration.  Further plans per nephrology  4. Essential hypertension Continue antihypertensive medications as already ordered, these medications have been reviewed and there are no changes at this time.   5. Coronary artery disease of native artery of native heart with stable angina pectoris ([place]) Continue cardiac and antihypertensive medications as already ordered and reviewed, no changes at this time.  Continue statin as ordered and reviewed, no changes at this time  Nitrates PRN for chest pain   6. Type 2 diabetes mellitus treated with insulin ([place]) Continue hypoglycemic medications as already ordered, these medications have been reviewed and there are no changes at this time.  Hgb A1C to be monitored as already arranged by primary service     Hortencia Pilar, MD  04/27/2020 12:59 PM

## 2020-04-30 ENCOUNTER — Encounter (INDEPENDENT_AMBULATORY_CARE_PROVIDER_SITE_OTHER): Payer: Self-pay | Admitting: Vascular Surgery

## 2020-05-25 ENCOUNTER — Encounter (INDEPENDENT_AMBULATORY_CARE_PROVIDER_SITE_OTHER): Payer: Self-pay | Admitting: Vascular Surgery

## 2020-05-25 ENCOUNTER — Other Ambulatory Visit: Payer: Self-pay

## 2020-05-25 ENCOUNTER — Ambulatory Visit (INDEPENDENT_AMBULATORY_CARE_PROVIDER_SITE_OTHER): Payer: Medicare Other | Admitting: Vascular Surgery

## 2020-05-25 VITALS — BP 117/73 | HR 80 | Ht 64.0 in | Wt 190.0 lb

## 2020-05-25 DIAGNOSIS — I7025 Atherosclerosis of native arteries of other extremities with ulceration: Secondary | ICD-10-CM

## 2020-05-25 DIAGNOSIS — E119 Type 2 diabetes mellitus without complications: Secondary | ICD-10-CM

## 2020-05-25 DIAGNOSIS — I25118 Atherosclerotic heart disease of native coronary artery with other forms of angina pectoris: Secondary | ICD-10-CM | POA: Diagnosis not present

## 2020-05-25 DIAGNOSIS — Z794 Long term (current) use of insulin: Secondary | ICD-10-CM

## 2020-05-25 DIAGNOSIS — E782 Mixed hyperlipidemia: Secondary | ICD-10-CM

## 2020-05-25 DIAGNOSIS — I10 Essential (primary) hypertension: Secondary | ICD-10-CM | POA: Diagnosis not present

## 2020-05-27 ENCOUNTER — Encounter (INDEPENDENT_AMBULATORY_CARE_PROVIDER_SITE_OTHER): Payer: Self-pay | Admitting: Vascular Surgery

## 2020-05-27 NOTE — Progress Notes (Signed)
MRN : [id number]  Sally Lynch is a 65 y.o. (07-Aug-1955) female who presents with chief complaint of  Chief Complaint  Patient presents with  . Follow-up    1 Mo no studies  .  History of Present Illness:   The patient returns to the office for followup and review of the noninvasive studies. The patient has been stable since last visit.  She continues to feel the would is healing.  There have been no significant changes to the patient's overall health care.  The patient denies amaurosis fugax or recent TIA symptoms. There are no recent neurological changes noted. The patient denies history of DVT, PE or superficial thrombophlebitis. The patient denies recent episodes of angina or shortness of breath.   Previous ABI's Rt=1.07 and Lt=1.00 (both sides are noncompressible) Duplex US of the right lower extremity arterial system shows the SFA remains patent  Previous access duplex had a flow volume of963m/min. The left brachial basilic AV fistula was patent throughout with no issueschronic stenosis is not hemodynamically significant at this time. (flow volume of1026 ml/min).This is compared to previous exam on7/22/2021.  Current Meds  Medication Sig  . acetaminophen (TYLENOL) 325 MG tablet Take 650 mg by mouth every 4 (four) hours as needed for moderate pain.  .Marland Kitchenaspirin 81 MG tablet Take 81 mg by mouth daily.   .Marland Kitchenatorvastatin (LIPITOR) 80 MG tablet Take 80 mg by mouth daily.   . calcitRIOL (ROCALTROL) 0.25 MCG capsule Take 2.25 mcg by mouth 3 (three) times a week.   . cholecalciferol (VITAMIN D) 1000 units tablet Take 1,000 Units by mouth daily.  . cinacalcet (SENSIPAR) 30 MG tablet Take 30 mg by mouth daily. Alternating with 60 mg  . cinacalcet (SENSIPAR) 60 MG tablet Take 60 mg by mouth every other day. alternating with 30 mg  . clopidogrel (PLAVIX) 75 MG tablet Take 75 mg by mouth daily.   .Marland Kitchendoxycycline (VIBRAMYCIN) 100 MG capsule Take 1 capsule (100 mg total) by  mouth 2 (two) times daily.  .Marland Kitchengabapentin (NEURONTIN) 300 MG capsule Take 300-600 mg by mouth at bedtime.   . hydrOXYzine (ATARAX/VISTARIL) 25 MG tablet Take 25 mg by mouth 3 (three) times daily as needed.  . insulin glargine (LANTUS) 100 UNIT/ML injection Inject 30 Units into the skin daily.   . Insulin Pen Needle (B-D UF III MINI PEN NEEDLES) 31G X 5 MM MISC Inject into the skin.  .Marland Kitchenlidocaine-prilocaine (EMLA) cream Apply topically.  .Marland KitchenLOKELMA 10 g PACK packet Take by mouth once a week.  . metoprolol succinate (TOPROL-XL) 25 MG 24 hr tablet Take 25 mg by mouth. Non-dialysis days  . midodrine (PROAMATINE) 5 MG tablet Take 1 tab 1/2 hr before dialysis, take 1 tab mid treatment prn hypotension  . moxifloxacin (VIGAMOX) 0.5 % ophthalmic solution Apply to eye.  . mupirocin ointment (BACTROBAN) 2 % Apply topically.  . mupirocin ointment (BACTROBAN) 2 % Apply to right foot wound daily  . nitroGLYCERIN (NITROSTAT) 0.4 MG SL tablet Place 0.4 mg under the tongue every 5 (five) minutes as needed for chest pain.  . pantoprazole (PROTONIX) 40 MG tablet Take 40 mg by mouth daily.  . sevelamer carbonate (RENVELA) 800 MG tablet Take 4,000 mg by mouth 3 (three) times daily.   . sodium polystyrene (KAYEXALATE) 15 GM/60ML suspension Take 20 g by mouth every other day.   . sodium zirconium cyclosilicate (LOKELMA) 10 g PACK packet Take by mouth.  . triamcinolone cream (KENALOG) 0.1 %  Mix 1:1 w Eucerin cream.  Apply to dry itchy skin twice daily prn  . VELPHORO 500 MG chewable tablet Chew by mouth.    Past Medical History:  Diagnosis Date  . Anginal pain ([place])   . Arthritis   . CHF (congestive heart failure) ([place])   . Chronic kidney disease   . Coronary artery disease   . Diabetes mellitus without complication ([place])   . GERD (gastroesophageal reflux disease)   . Hypertension   . Myocardial infarction [facility])     Past Surgical History:  Procedure Laterality Date  . A/V FISTULAGRAM Left 01/06/2018    Procedure: A/V FISTULAGRAM;  Surgeon: Katha Cabal, MD;  Location: [place] [facility];  Service: Cardiovascular;  Laterality: Left;  . AV FISTULA PLACEMENT  2011  . CARDIAC CATHETERIZATION    . EYE SURGERY     bilateral cararact surgery  . PERIPHERAL VASCULAR CATHETERIZATION N/A 07/12/2015   Procedure: A/V Shuntogram/Fistulagram;  Surgeon: Katha Cabal, MD;  Location: [place] [facility];  Service: Cardiovascular;  Laterality: N/A;    Social History Social History   Tobacco Use  . Smoking status: Never Smoker  . Smokeless tobacco: Never Used  Substance Use Topics  . Alcohol use: No  . Drug use: No    Family History Family History  Problem Relation Age of Onset  . Hypertension Mother   . Diabetes Mother   . Diabetes Father   . Heart attack Father     Allergies  Allergen Reactions  . Shellfish Allergy Hives  . Contrast Media [Iodinated Diagnostic Agents] Hives  . Furosemide Rash  . Other Rash    Raw fruits  . Peanut Oil Rash     REVIEW OF SYSTEMS (Negative unless checked)  Constitutional: '[]'$ Weight loss  '[]'$ Fever  '[]'$ Chills Cardiac: '[]'$ Chest pain   '[]'$ Chest pressure   '[]'$ Palpitations   '[]'$ Shortness of breath when laying flat   '[]'$ Shortness of breath with exertion. Vascular:  '[]'$ Pain in legs with walking   '[]'$ Pain in legs at rest  '[]'$ History of DVT   '[]'$ Phlebitis   '[]'$ Swelling in legs   '[]'$ Varicose veins   '[]'$ Non-healing ulcers Pulmonary:   '[]'$ Uses home oxygen   '[]'$ Productive cough   '[]'$ Hemoptysis   '[]'$ Wheeze  '[]'$ COPD   '[]'$ Asthma Neurologic:  '[]'$ Dizziness   '[]'$ Seizures   '[]'$ History of stroke   '[]'$ History of TIA  '[]'$ Aphasia   '[]'$ Vissual changes   '[]'$ Weakness or numbness in arm   '[]'$ Weakness or numbness in leg Musculoskeletal:   '[]'$ Joint swelling   '[]'$ Joint pain   '[]'$ Low back pain Hematologic:  '[]'$ Easy bruising  '[]'$ Easy bleeding   '[]'$ Hypercoagulable state   '[]'$ Anemic Gastrointestinal:  '[]'$ Diarrhea   '[]'$ Vomiting  '[]'$ Gastroesophageal reflux/heartburn   '[]'$ Difficulty swallowing. Genitourinary:   '[]'$ Chronic kidney disease   '[]'$ Difficult urination  '[]'$ Frequent urination   '[]'$ Blood in urine Skin:  '[]'$ Rashes   '[]'$ Ulcers  Psychological:  '[]'$ History of anxiety   '[]'$  History of major depression.  Physical Examination  Vitals:   05/25/20 1130  BP: 117/73  Pulse: 80  Weight: 190 lb (86.2 kg)  Height: '5\' 4"'$  (1.626 m)   Body mass index is 32.61 kg/m. Gen: WD/WN, NAD Head: [place]/AT, No temporalis wasting.  Ear/Nose/Throat: Hearing grossly intact, nares w/o erythema or drainage Eyes: PER, EOMI, sclera nonicteric.  Neck: Supple, no large masses.   Pulmonary:  Good air movement, no audible wheezing bilaterally, no use of accessory muscles.  Cardiac: RRR, no JVD Vascular: right great toe ulcer does appear to be epithelializing Vessel Right  Left  Radial Palpable Palpable  PT Not Palpable Not Palpable  DP Not Palpable Not Palpable  Gastrointestinal: Non-distended. No guarding/no peritoneal signs.  Musculoskeletal: M/S 5/5 throughout.  No deformity or atrophy.  Neurologic: CN 2-12 intact. Symmetrical.  Speech is fluent. Motor exam as listed above. Psychiatric: Judgment intact, Mood & affect appropriate for pt's clinical situation. Dermatologic: No rashes or ulcers noted.  No changes consistent with cellulitis. Lymph : No lichenification or skin changes of chronic lymphedema.  CBC Lab Results  Component Value Date   WBC 10.1 06/14/2019   HGB 10.7 (L) 06/14/2019   HCT 34.6 (L) 06/14/2019   MCV 103.9 (H) 06/14/2019   PLT 132 (L) 06/14/2019    BMET    Component Value Date/Time   NA 139 06/14/2019 0738   K 7.3 (HH) 06/14/2019 0738   CL 98 06/14/2019 0738   CO2 25 06/14/2019 0738   GLUCOSE 315 (H) 06/14/2019 0738   BUN 61 (H) 06/14/2019 0738   CREATININE 7.38 (H) 06/14/2019 1140   CALCIUM 8.6 (L) 06/14/2019 0738   GFRNONAA 5 (L) 06/14/2019 1140   GFRAA 6 (L) 06/14/2019 1140   CrCl cannot be calculated (Patient's most recent lab result is older than the maximum 21 days  allowed.).  COAG No results found for: INR, PROTIME  Radiology No results found.   Assessment/Plan 1. Atherosclerosis of native arteries of the extremities with ulceration ([place]) Recommend:  The patient has evidence of atherosclerosis of the lower extremities with a right great toe ulcer.  The patient does not voice lifestyle limiting changes at this point in time.  She feels that the wound is healing and upon evaluation today I would agree so it is hard to encourage her to move forward with an angiogram.  I will see her in two months and if we are still not healed then angiogram will be recommended.  Noninvasive studies do not suggest clinically significant change.  No invasive studies, angiography or surgery at this time The patient should continue walking and begin a more formal exercise program.  The patient should continue antiplatelet therapy and aggressive treatment of the lipid abnormalities  No changes in the patient's medications at this time  The patient should continue wearing graduated compression socks 10-15 mmHg strength to control the mild edema.   2. Coronary artery disease of native artery of native heart with stable angina pectoris ([place]) Continue cardiac and antihypertensive medications as already ordered and reviewed, no changes at this time.  Continue statin as ordered and reviewed, no changes at this time  Nitrates PRN for chest pain   3. Essential hypertension Continue antihypertensive medications as already ordered, these medications have been reviewed and there are no changes at this time.   4. Type 2 diabetes mellitus treated with insulin ([place]) Continue hypoglycemic medications as already ordered, these medications have been reviewed and there are no changes at this time.  Hgb A1C to be monitored as already arranged by primary service   5. Mixed hyperlipidemia Continue statin as ordered and reviewed, no changes at this time     Hortencia Pilar, MD  05/27/2020 4:56 PM

## 2020-07-27 ENCOUNTER — Ambulatory Visit (INDEPENDENT_AMBULATORY_CARE_PROVIDER_SITE_OTHER): Payer: Medicare Other | Admitting: Vascular Surgery

## 2020-08-10 ENCOUNTER — Other Ambulatory Visit: Payer: Self-pay

## 2020-08-10 ENCOUNTER — Ambulatory Visit (INDEPENDENT_AMBULATORY_CARE_PROVIDER_SITE_OTHER): Payer: Medicare Other | Admitting: Vascular Surgery

## 2020-08-10 ENCOUNTER — Encounter (INDEPENDENT_AMBULATORY_CARE_PROVIDER_SITE_OTHER): Payer: Self-pay | Admitting: Vascular Surgery

## 2020-08-10 ENCOUNTER — Telehealth (INDEPENDENT_AMBULATORY_CARE_PROVIDER_SITE_OTHER): Payer: Self-pay

## 2020-08-10 VITALS — BP 98/56 | HR 81 | Resp 16 | Wt 183.8 lb

## 2020-08-10 DIAGNOSIS — I7025 Atherosclerosis of native arteries of other extremities with ulceration: Secondary | ICD-10-CM | POA: Diagnosis not present

## 2020-08-10 DIAGNOSIS — I25118 Atherosclerotic heart disease of native coronary artery with other forms of angina pectoris: Secondary | ICD-10-CM

## 2020-08-10 DIAGNOSIS — E782 Mixed hyperlipidemia: Secondary | ICD-10-CM

## 2020-08-10 DIAGNOSIS — N186 End stage renal disease: Secondary | ICD-10-CM | POA: Diagnosis not present

## 2020-08-10 DIAGNOSIS — Z992 Dependence on renal dialysis: Secondary | ICD-10-CM

## 2020-08-10 DIAGNOSIS — I10 Essential (primary) hypertension: Secondary | ICD-10-CM

## 2020-08-10 NOTE — Progress Notes (Signed)
MRN : [id number]  Sally Lynch is a 65 y.o. (1955-10-04) female who presents with chief complaint of No chief complaint on file. Marland Kitchen  History of Present Illness:  The patient returns to the office for followup and review of the noninvasive studies. The patient has been stable since last visit.  Today she feels that the wound is really not healing and has concerns about a "kissing Ulcer" that has also developed on the right.  There have been no significant changes to the patient's overall health care.  The patient denies amaurosis fugax or recent TIA symptoms. There are no recent neurological changes noted. The patient denies history of DVT, PE or superficial thrombophlebitis. The patient denies recent episodes of angina or shortness of breath.   Previous ABI's Rt=1.07and Lt=1.00(both sides are noncompressible) Duplex US of therightlower extremity arterial system shows the SFA remains patent    No outpatient medications have been marked as taking for the 08/10/20 encounter (Appointment) with Delana Meyer, Dolores Lory, MD.    Past Medical History:  Diagnosis Date  . Anginal pain ([place])   . Arthritis   . CHF (congestive heart failure) ([place])   . Chronic kidney disease   . Coronary artery disease   . Diabetes mellitus without complication ([place])   . GERD (gastroesophageal reflux disease)   . Hypertension   . Myocardial infarction [facility])     Past Surgical History:  Procedure Laterality Date  . A/V FISTULAGRAM Left 01/06/2018   Procedure: A/V FISTULAGRAM;  Surgeon: Katha Cabal, MD;  Location: [place] [facility];  Service: Cardiovascular;  Laterality: Left;  . AV FISTULA PLACEMENT  2011  . CARDIAC CATHETERIZATION    . EYE SURGERY     bilateral cararact surgery  . PERIPHERAL VASCULAR CATHETERIZATION N/A 07/12/2015   Procedure: A/V Shuntogram/Fistulagram;  Surgeon: Katha Cabal, MD;  Location: [place] [facility];  Service: Cardiovascular;  Laterality: N/A;     Social History Social History   Tobacco Use  . Smoking status: Never Smoker  . Smokeless tobacco: Never Used  Substance Use Topics  . Alcohol use: No  . Drug use: No    Family History Family History  Problem Relation Age of Onset  . Hypertension Mother   . Diabetes Mother   . Diabetes Father   . Heart attack Father     Allergies  Allergen Reactions  . Shellfish Allergy Hives  . Contrast Media [Iodinated Diagnostic Agents] Hives  . Furosemide Rash  . Other Rash    Raw fruits  . Peanut Oil Rash     REVIEW OF SYSTEMS (Negative unless checked)  Constitutional: '[]'$ Weight loss  '[]'$ Fever  '[]'$ Chills Cardiac: '[]'$ Chest pain   '[]'$ Chest pressure   '[]'$ Palpitations   '[]'$ Shortness of breath when laying flat   '[]'$ Shortness of breath with exertion. Vascular:  '[]'$ Pain in legs with walking   '[x]'$ Pain in legs at rest  '[]'$ History of DVT   '[]'$ Phlebitis   '[]'$ Swelling in legs   '[]'$ Varicose veins   '[x]'$ Non-healing ulcers Pulmonary:   '[]'$ Uses home oxygen   '[]'$ Productive cough   '[]'$ Hemoptysis   '[]'$ Wheeze  '[]'$ COPD   '[]'$ Asthma Neurologic:  '[]'$ Dizziness   '[]'$ Seizures   '[]'$ History of stroke   '[]'$ History of TIA  '[]'$ Aphasia   '[]'$ Vissual changes   '[]'$ Weakness or numbness in arm   '[]'$ Weakness or numbness in leg Musculoskeletal:   '[]'$ Joint swelling   '[]'$ Joint pain   '[]'$ Low back pain Hematologic:  '[]'$ Easy bruising  '[]'$ Easy bleeding   '[]'$ Hypercoagulable state   '[]'$   Anemic Gastrointestinal:  '[]'$ Diarrhea   '[]'$ Vomiting  '[]'$ Gastroesophageal reflux/heartburn   '[]'$ Difficulty swallowing. Genitourinary:  '[x]'$ Chronic kidney disease   '[]'$ Difficult urination  '[]'$ Frequent urination   '[]'$ Blood in urine Skin:  '[]'$ Rashes   '[]'$ Ulcers  Psychological:  '[]'$ History of anxiety   '[]'$  History of major depression.  Physical Examination  There were no vitals filed for this visit. There is no height or weight on file to calculate BMI. Gen: WD/WN, NAD Head: [place]/AT, No temporalis wasting.  Ear/Nose/Throat: Hearing grossly intact, nares w/o erythema or drainage Eyes: PER,  EOMI, sclera nonicteric.  Neck: Supple, no large masses.   Pulmonary:  Good air movement, no audible wheezing bilaterally, no use of accessory muscles.  Cardiac: RRR, no JVD Vascular: Ulcer right toe is unchanged not epithelialized and noninfected.  Left upper arm AV access good thrill and good bruit Vessel Right Left  Radial Palpable Palpable  PT Not Palpable Not Palpable  DP Not Palpable Not Palpable  Gastrointestinal: Non-distended. No guarding/no peritoneal signs.  Musculoskeletal: M/S 5/5 throughout.  No deformity or atrophy.  Neurologic: CN 2-12 intact. Symmetrical.  Speech is fluent. Motor exam as listed above. Psychiatric: Judgment intact, Mood & affect appropriate for pt's clinical situation. Dermatologic: No rashes or ulcers noted.  No changes consistent with cellulitis.   CBC Lab Results  Component Value Date   WBC 10.1 06/14/2019   HGB 10.7 (L) 06/14/2019   HCT 34.6 (L) 06/14/2019   MCV 103.9 (H) 06/14/2019   PLT 132 (L) 06/14/2019    BMET    Component Value Date/Time   NA 139 06/14/2019 0738   K 7.3 (HH) 06/14/2019 0738   CL 98 06/14/2019 0738   CO2 25 06/14/2019 0738   GLUCOSE 315 (H) 06/14/2019 0738   BUN 61 (H) 06/14/2019 0738   CREATININE 7.38 (H) 06/14/2019 1140   CALCIUM 8.6 (L) 06/14/2019 0738   GFRNONAA 5 (L) 06/14/2019 1140   GFRAA 6 (L) 06/14/2019 1140   CrCl cannot be calculated (Patient's most recent lab result is older than the maximum 21 days allowed.).  COAG No results found for: INR, PROTIME  Radiology No results found.   Assessment/Plan 1. Atherosclerosis of native arteries of the extremities with ulceration ([place])  Recommend:  The patient has evidence of severe atherosclerotic changes of both lower extremities associated with ulceration and tissue loss of the right foot.  This represents a limb threatening ischemia and places the patient at the risk for right limb loss.  Patient should undergo angiography of the right lower  extremities with the hope for intervention for limb salvage.  The risks and benefits as well as the alternative therapies was discussed in detail with the patient.  All questions were answered.  Patient agrees to proceed with right leg angiography.  The patient will follow up with me in the office after the procedure.    2. ESRD on dialysis ([place]) At the present time the patient has adequate dialysis access.  Continue hemodialysis as ordered without interruption.  Avoid nephrotoxic medications and dehydration.  Further plans per nephrology  3. Essential hypertension Continue antihypertensive medications as already ordered, these medications have been reviewed and there are no changes at this time.   4. Coronary artery disease of native artery of native heart with stable angina pectoris (HCC) Continue cardiac and antihypertensive medications as already ordered and reviewed, no changes at this time.  Continue statin as ordered and reviewed, no changes at this time  Nitrates PRN for chest pain   5.  Mixed hyperlipidemia Continue statin as ordered and reviewed, no changes at this time     Hortencia Pilar, MD  08/10/2020 1:11 PM

## 2020-08-10 NOTE — H&P (View-Only) (Signed)
MRN : [id number]  Sally Lynch is a 65 y.o. (11-02-1955) female who presents with chief complaint of No chief complaint on file. Marland Kitchen  History of Present Illness:  The patient returns to the office for followup and review of the noninvasive studies. The patient has been stable since last visit.  Today she feels that the wound is really not healing and has concerns about a "kissing Ulcer" that has also developed on the right.  There have been no significant changes to the patient's overall health care.  The patient denies amaurosis fugax or recent TIA symptoms. There are no recent neurological changes noted. The patient denies history of DVT, PE or superficial thrombophlebitis. The patient denies recent episodes of angina or shortness of breath.   Previous ABI's Rt=1.07and Lt=1.00(both sides are noncompressible) Duplex US of therightlower extremity arterial system shows the SFA remains patent    No outpatient medications have been marked as taking for the 08/10/20 encounter (Appointment) with Delana Meyer, Dolores Lory, MD.    Past Medical History:  Diagnosis Date  . Anginal pain ([place])   . Arthritis   . CHF (congestive heart failure) ([place])   . Chronic kidney disease   . Coronary artery disease   . Diabetes mellitus without complication ([place])   . GERD (gastroesophageal reflux disease)   . Hypertension   . Myocardial infarction [facility])     Past Surgical History:  Procedure Laterality Date  . A/V FISTULAGRAM Left 01/06/2018   Procedure: A/V FISTULAGRAM;  Surgeon: Katha Cabal, MD;  Location: [place] [facility];  Service: Cardiovascular;  Laterality: Left;  . AV FISTULA PLACEMENT  2011  . CARDIAC CATHETERIZATION    . EYE SURGERY     bilateral cararact surgery  . PERIPHERAL VASCULAR CATHETERIZATION N/A 07/12/2015   Procedure: A/V Shuntogram/Fistulagram;  Surgeon: Katha Cabal, MD;  Location: [place] [facility];  Service: Cardiovascular;  Laterality: N/A;     Social History Social History   Tobacco Use  . Smoking status: Never Smoker  . Smokeless tobacco: Never Used  Substance Use Topics  . Alcohol use: No  . Drug use: No    Family History Family History  Problem Relation Age of Onset  . Hypertension Mother   . Diabetes Mother   . Diabetes Father   . Heart attack Father     Allergies  Allergen Reactions  . Shellfish Allergy Hives  . Contrast Media [Iodinated Diagnostic Agents] Hives  . Furosemide Rash  . Other Rash    Raw fruits  . Peanut Oil Rash     REVIEW OF SYSTEMS (Negative unless checked)  Constitutional: '[]'$ Weight loss  '[]'$ Fever  '[]'$ Chills Cardiac: '[]'$ Chest pain   '[]'$ Chest pressure   '[]'$ Palpitations   '[]'$ Shortness of breath when laying flat   '[]'$ Shortness of breath with exertion. Vascular:  '[]'$ Pain in legs with walking   '[x]'$ Pain in legs at rest  '[]'$ History of DVT   '[]'$ Phlebitis   '[]'$ Swelling in legs   '[]'$ Varicose veins   '[x]'$ Non-healing ulcers Pulmonary:   '[]'$ Uses home oxygen   '[]'$ Productive cough   '[]'$ Hemoptysis   '[]'$ Wheeze  '[]'$ COPD   '[]'$ Asthma Neurologic:  '[]'$ Dizziness   '[]'$ Seizures   '[]'$ History of stroke   '[]'$ History of TIA  '[]'$ Aphasia   '[]'$ Vissual changes   '[]'$ Weakness or numbness in arm   '[]'$ Weakness or numbness in leg Musculoskeletal:   '[]'$ Joint swelling   '[]'$ Joint pain   '[]'$ Low back pain Hematologic:  '[]'$ Easy bruising  '[]'$ Easy bleeding   '[]'$ Hypercoagulable state   '[]'$   Anemic Gastrointestinal:  '[]'$ Diarrhea   '[]'$ Vomiting  '[]'$ Gastroesophageal reflux/heartburn   '[]'$ Difficulty swallowing. Genitourinary:  '[x]'$ Chronic kidney disease   '[]'$ Difficult urination  '[]'$ Frequent urination   '[]'$ Blood in urine Skin:  '[]'$ Rashes   '[]'$ Ulcers  Psychological:  '[]'$ History of anxiety   '[]'$  History of major depression.  Physical Examination  There were no vitals filed for this visit. There is no height or weight on file to calculate BMI. Gen: WD/WN, NAD Head: [place]/AT, No temporalis wasting.  Ear/Nose/Throat: Hearing grossly intact, nares w/o erythema or drainage Eyes: PER,  EOMI, sclera nonicteric.  Neck: Supple, no large masses.   Pulmonary:  Good air movement, no audible wheezing bilaterally, no use of accessory muscles.  Cardiac: RRR, no JVD Vascular: Ulcer right toe is unchanged not epithelialized and noninfected.  Left upper arm AV access good thrill and good bruit Vessel Right Left  Radial Palpable Palpable  PT Not Palpable Not Palpable  DP Not Palpable Not Palpable  Gastrointestinal: Non-distended. No guarding/no peritoneal signs.  Musculoskeletal: M/S 5/5 throughout.  No deformity or atrophy.  Neurologic: CN 2-12 intact. Symmetrical.  Speech is fluent. Motor exam as listed above. Psychiatric: Judgment intact, Mood & affect appropriate for pt's clinical situation. Dermatologic: No rashes or ulcers noted.  No changes consistent with cellulitis.   CBC Lab Results  Component Value Date   WBC 10.1 06/14/2019   HGB 10.7 (L) 06/14/2019   HCT 34.6 (L) 06/14/2019   MCV 103.9 (H) 06/14/2019   PLT 132 (L) 06/14/2019    BMET    Component Value Date/Time   NA 139 06/14/2019 0738   K 7.3 (HH) 06/14/2019 0738   CL 98 06/14/2019 0738   CO2 25 06/14/2019 0738   GLUCOSE 315 (H) 06/14/2019 0738   BUN 61 (H) 06/14/2019 0738   CREATININE 7.38 (H) 06/14/2019 1140   CALCIUM 8.6 (L) 06/14/2019 0738   GFRNONAA 5 (L) 06/14/2019 1140   GFRAA 6 (L) 06/14/2019 1140   CrCl cannot be calculated (Patient's most recent lab result is older than the maximum 21 days allowed.).  COAG No results found for: INR, PROTIME  Radiology No results found.   Assessment/Plan 1. Atherosclerosis of native arteries of the extremities with ulceration ([place])  Recommend:  The patient has evidence of severe atherosclerotic changes of both lower extremities associated with ulceration and tissue loss of the right foot.  This represents a limb threatening ischemia and places the patient at the risk for right limb loss.  Patient should undergo angiography of the right lower  extremities with the hope for intervention for limb salvage.  The risks and benefits as well as the alternative therapies was discussed in detail with the patient.  All questions were answered.  Patient agrees to proceed with right leg angiography.  The patient will follow up with me in the office after the procedure.    2. ESRD on dialysis ([place]) At the present time the patient has adequate dialysis access.  Continue hemodialysis as ordered without interruption.  Avoid nephrotoxic medications and dehydration.  Further plans per nephrology  3. Essential hypertension Continue antihypertensive medications as already ordered, these medications have been reviewed and there are no changes at this time.   4. Coronary artery disease of native artery of native heart with stable angina pectoris (HCC) Continue cardiac and antihypertensive medications as already ordered and reviewed, no changes at this time.  Continue statin as ordered and reviewed, no changes at this time  Nitrates PRN for chest pain   5.  Mixed hyperlipidemia Continue statin as ordered and reviewed, no changes at this time     Hortencia Pilar, MD  08/10/2020 1:11 PM

## 2020-08-10 NOTE — Telephone Encounter (Signed)
Spoke with the patient and she is scheduled with Dr. Delana Meyer for a right leg angio on 08/29/20 at 9:00 am at the [facility]. Pre-procedure instructions were discussed and will be mailed.

## 2020-08-13 ENCOUNTER — Encounter (INDEPENDENT_AMBULATORY_CARE_PROVIDER_SITE_OTHER): Payer: Self-pay | Admitting: Vascular Surgery

## 2020-08-23 ENCOUNTER — Telehealth (INDEPENDENT_AMBULATORY_CARE_PROVIDER_SITE_OTHER): Payer: Self-pay

## 2020-08-23 NOTE — Telephone Encounter (Addendum)
Patient left a voicemail stated that she is currently admitted at [facility] for difficulty breathing and right toe wound. The patient informed that she was at [facility] in [place] then transfer to [facility] due to the concern with right toe wound.The patient wanted Dr Delana Meyer to be made aware with the updated medical change. The patient plans to keep her upcoming procedure that is schedule for 08/29/20. I left a message on the patient voicemail that if the providers from [facility] needed to speak with Dr Delana Meyer they contact our office

## 2020-08-24 ENCOUNTER — Telehealth (INDEPENDENT_AMBULATORY_CARE_PROVIDER_SITE_OTHER): Payer: Self-pay

## 2020-08-24 NOTE — Telephone Encounter (Signed)
I spoke to Dr. Delana Meyer and he said that as long as she is discharged today, we can continue with her angiogram as planned on 06/14

## 2020-08-24 NOTE — Telephone Encounter (Signed)
Dr Delana Meyer has been made aware with information from prior note

## 2020-08-24 NOTE — Telephone Encounter (Signed)
I called the pt and made her aware of the NP's instructions she made me aware that she was discharged.

## 2020-08-24 NOTE — Telephone Encounter (Signed)
The  pt called and left a VM on the nurses line saying that she is currently at [facility] an she is due to have a LE Angio on 6/14. The pt gave the following number to be reached at [facility] . [phone] if you can't reach her there you can try her cell but it  might not go through due to being in the  hospital  the number is as follows [phone].The pt did mention she wants to come to her procedure on that day.

## 2020-08-28 ENCOUNTER — Telehealth (INDEPENDENT_AMBULATORY_CARE_PROVIDER_SITE_OTHER): Payer: Self-pay

## 2020-08-28 ENCOUNTER — Other Ambulatory Visit (INDEPENDENT_AMBULATORY_CARE_PROVIDER_SITE_OTHER): Payer: Self-pay | Admitting: Nurse Practitioner

## 2020-08-28 NOTE — Telephone Encounter (Signed)
Patient called about her feet not being good and wanting Dr. Delana Meyer to take care of them. Patient was advised that she is having a lower extremity angiogram on 08/29/20 which will most likely take care of some of her issues with her blood flow, legs and feet.

## 2020-08-29 ENCOUNTER — Encounter
Admission: RE | Disposition: A | Discharge status: Home Health | Payer: Self-pay | Source: Home / Self Care | Attending: Vascular Surgery

## 2020-08-29 ENCOUNTER — Inpatient Hospital Stay
Admission: RE | Admit: 2020-08-29 | Discharge: 2020-09-01 | DRG: 252 | Disposition: A | Discharge status: Home Health | Payer: Medicare Other | Attending: Vascular Surgery | Admitting: Vascular Surgery

## 2020-08-29 ENCOUNTER — Other Ambulatory Visit: Payer: Self-pay

## 2020-08-29 ENCOUNTER — Encounter: Payer: Self-pay | Admitting: Vascular Surgery

## 2020-08-29 DIAGNOSIS — Z794 Long term (current) use of insulin: Secondary | ICD-10-CM

## 2020-08-29 DIAGNOSIS — Z8249 Family history of ischemic heart disease and other diseases of the circulatory system: Secondary | ICD-10-CM

## 2020-08-29 DIAGNOSIS — K219 Gastro-esophageal reflux disease without esophagitis: Secondary | ICD-10-CM | POA: Diagnosis present

## 2020-08-29 DIAGNOSIS — I70261 Atherosclerosis of native arteries of extremities with gangrene, right leg: Secondary | ICD-10-CM | POA: Diagnosis present

## 2020-08-29 DIAGNOSIS — I70235 Atherosclerosis of native arteries of right leg with ulceration of other part of foot: Principal | ICD-10-CM | POA: Diagnosis present

## 2020-08-29 DIAGNOSIS — Z91018 Allergy to other foods: Secondary | ICD-10-CM

## 2020-08-29 DIAGNOSIS — Z833 Family history of diabetes mellitus: Secondary | ICD-10-CM

## 2020-08-29 DIAGNOSIS — Z992 Dependence on renal dialysis: Secondary | ICD-10-CM

## 2020-08-29 DIAGNOSIS — I442 Atrioventricular block, complete: Secondary | ICD-10-CM | POA: Diagnosis present

## 2020-08-29 DIAGNOSIS — M1990 Unspecified osteoarthritis, unspecified site: Secondary | ICD-10-CM | POA: Diagnosis present

## 2020-08-29 DIAGNOSIS — E11621 Type 2 diabetes mellitus with foot ulcer: Secondary | ICD-10-CM | POA: Diagnosis present

## 2020-08-29 DIAGNOSIS — E782 Mixed hyperlipidemia: Secondary | ICD-10-CM | POA: Diagnosis present

## 2020-08-29 DIAGNOSIS — I509 Heart failure, unspecified: Secondary | ICD-10-CM | POA: Diagnosis present

## 2020-08-29 DIAGNOSIS — Z79899 Other long term (current) drug therapy: Secondary | ICD-10-CM

## 2020-08-29 DIAGNOSIS — I25118 Atherosclerotic heart disease of native coronary artery with other forms of angina pectoris: Secondary | ICD-10-CM | POA: Diagnosis present

## 2020-08-29 DIAGNOSIS — N2581 Secondary hyperparathyroidism of renal origin: Secondary | ICD-10-CM | POA: Diagnosis present

## 2020-08-29 DIAGNOSIS — I70299 Other atherosclerosis of native arteries of extremities, unspecified extremity: Secondary | ICD-10-CM

## 2020-08-29 DIAGNOSIS — I959 Hypotension, unspecified: Secondary | ICD-10-CM | POA: Diagnosis not present

## 2020-08-29 DIAGNOSIS — I70269 Atherosclerosis of native arteries of extremities with gangrene, unspecified extremity: Secondary | ICD-10-CM | POA: Diagnosis present

## 2020-08-29 DIAGNOSIS — N186 End stage renal disease: Secondary | ICD-10-CM | POA: Diagnosis present

## 2020-08-29 DIAGNOSIS — L97519 Non-pressure chronic ulcer of other part of right foot with unspecified severity: Secondary | ICD-10-CM

## 2020-08-29 DIAGNOSIS — I252 Old myocardial infarction: Secondary | ICD-10-CM

## 2020-08-29 DIAGNOSIS — Z91010 Allergy to peanuts: Secondary | ICD-10-CM

## 2020-08-29 DIAGNOSIS — Z7901 Long term (current) use of anticoagulants: Secondary | ICD-10-CM

## 2020-08-29 DIAGNOSIS — E1122 Type 2 diabetes mellitus with diabetic chronic kidney disease: Secondary | ICD-10-CM | POA: Diagnosis present

## 2020-08-29 DIAGNOSIS — D631 Anemia in chronic kidney disease: Secondary | ICD-10-CM | POA: Diagnosis present

## 2020-08-29 DIAGNOSIS — Z91041 Radiographic dye allergy status: Secondary | ICD-10-CM

## 2020-08-29 DIAGNOSIS — I459 Conduction disorder, unspecified: Secondary | ICD-10-CM | POA: Diagnosis present

## 2020-08-29 DIAGNOSIS — Z955 Presence of coronary angioplasty implant and graft: Secondary | ICD-10-CM

## 2020-08-29 DIAGNOSIS — Z91013 Allergy to seafood: Secondary | ICD-10-CM

## 2020-08-29 DIAGNOSIS — R001 Bradycardia, unspecified: Secondary | ICD-10-CM | POA: Diagnosis not present

## 2020-08-29 DIAGNOSIS — Z888 Allergy status to other drugs, medicaments and biological substances status: Secondary | ICD-10-CM

## 2020-08-29 DIAGNOSIS — I132 Hypertensive heart and chronic kidney disease with heart failure and with stage 5 chronic kidney disease, or end stage renal disease: Secondary | ICD-10-CM | POA: Diagnosis present

## 2020-08-29 DIAGNOSIS — E1152 Type 2 diabetes mellitus with diabetic peripheral angiopathy with gangrene: Secondary | ICD-10-CM | POA: Diagnosis present

## 2020-08-29 DIAGNOSIS — I70263 Atherosclerosis of native arteries of extremities with gangrene, bilateral legs: Secondary | ICD-10-CM

## 2020-08-29 HISTORY — PX: LOWER EXTREMITY ANGIOGRAPHY: CATH118251

## 2020-08-29 LAB — POTASSIUM (ARMC VASCULAR LAB ONLY): Potassium (ARMC vascular lab): 4.5 (ref 3.5–5.1)

## 2020-08-29 LAB — GLUCOSE, CAPILLARY
Glucose-Capillary: 93 mg/dL (ref 70–99)
Glucose-Capillary: 83 mg/dL (ref 70–99)

## 2020-08-29 SURGERY — LOWER EXTREMITY ANGIOGRAPHY
Anesthesia: Moderate Sedation | Site: Leg Lower | Laterality: Right

## 2020-08-29 MED ORDER — MIDODRINE HCL 5 MG PO TABS
5.0000 mg | ORAL_TABLET | Freq: Two times a day (BID) | ORAL | Status: DC
  Administered 2020-08-31 – 2020-09-01 (×3): 5 mg via ORAL
  Filled 2020-08-29 (×3): qty 1

## 2020-08-29 MED ORDER — ACETAMINOPHEN 325 MG PO TABS
650.0000 mg | ORAL_TABLET | ORAL | Status: DC | PRN
  Administered 2020-08-29: 650 mg via ORAL

## 2020-08-29 MED ORDER — METOPROLOL SUCCINATE ER 50 MG PO TB24: 25.0000 mg | ORAL_TABLET | Freq: Every day | ORAL | Status: DC

## 2020-08-29 MED ORDER — CINACALCET HCL 30 MG PO TABS
30.0000 mg | ORAL_TABLET | ORAL | Status: DC
  Administered 2020-09-01: 30 mg via ORAL
  Filled 2020-08-29 (×2): qty 1

## 2020-08-29 MED ORDER — DOXYCYCLINE HYCLATE 100 MG PO TABS
100.0000 mg | ORAL_TABLET | Freq: Two times a day (BID) | ORAL | Status: DC
  Administered 2020-08-29 – 2020-09-01 (×5): 100 mg via ORAL
  Filled 2020-08-29 (×5): qty 1

## 2020-08-29 MED ORDER — SUCROFERRIC OXYHYDROXIDE 500 MG PO CHEW
500.0000 mg | CHEWABLE_TABLET | Freq: Three times a day (TID) | ORAL | Status: DC
  Administered 2020-08-31 – 2020-09-01 (×3): 500 mg via ORAL
  Filled 2020-08-29 (×9): qty 1

## 2020-08-29 MED ORDER — MIDAZOLAM HCL 2 MG/ML PO SYRP: 8.0000 mg | ORAL_SOLUTION | Freq: Once | ORAL | Status: DC | PRN

## 2020-08-29 MED ORDER — FAMOTIDINE 20 MG PO TABS
ORAL_TABLET | ORAL | Status: AC
  Administered 2020-08-29: 40 mg via ORAL
  Filled 2020-08-29: qty 1

## 2020-08-29 MED ORDER — MIDAZOLAM HCL 5 MG/5ML IJ SOLN
INTRAMUSCULAR | Status: AC
  Filled 2020-08-29: qty 5

## 2020-08-29 MED ORDER — SODIUM CHLORIDE 0.9 % IV SOLN: 1.0000 g | Freq: Once | INTRAVENOUS | Status: AC

## 2020-08-29 MED ORDER — FENTANYL CITRATE (PF) 100 MCG/2ML IJ SOLN
INTRAMUSCULAR | Status: AC
  Filled 2020-08-29: qty 2

## 2020-08-29 MED ORDER — METHYLPREDNISOLONE SODIUM SUCC 125 MG IJ SOLR
INTRAMUSCULAR | Status: AC
  Administered 2020-08-29: 125 mg via INTRAVENOUS
  Filled 2020-08-29: qty 2

## 2020-08-29 MED ORDER — LABETALOL HCL 5 MG/ML IV SOLN: 10.0000 mg | INTRAVENOUS | Status: DC | PRN

## 2020-08-29 MED ORDER — CALCITRIOL 0.25 MCG PO CAPS
2.2500 ug | ORAL_CAPSULE | ORAL | Status: DC
  Administered 2020-09-01: 2.25 ug via ORAL
  Filled 2020-08-29 (×2): qty 9

## 2020-08-29 MED ORDER — ONDANSETRON HCL 4 MG/2ML IJ SOLN: 4.0000 mg | Freq: Four times a day (QID) | INTRAMUSCULAR | Status: DC | PRN

## 2020-08-29 MED ORDER — PANTOPRAZOLE SODIUM 40 MG PO TBEC
40.0000 mg | DELAYED_RELEASE_TABLET | Freq: Every day | ORAL | Status: DC
  Administered 2020-08-31 – 2020-09-01 (×2): 40 mg via ORAL
  Filled 2020-08-29 (×2): qty 1

## 2020-08-29 MED ORDER — DIPHENHYDRAMINE HCL 50 MG/ML IJ SOLN: 50.0000 mg | Freq: Once | INTRAMUSCULAR | Status: AC | PRN

## 2020-08-29 MED ORDER — FAMOTIDINE 20 MG PO TABS: 40.0000 mg | ORAL_TABLET | Freq: Once | ORAL | Status: AC | PRN

## 2020-08-29 MED ORDER — SODIUM ZIRCONIUM CYCLOSILICATE 10 G PO PACK
10.0000 g | PACK | Freq: Every day | ORAL | Status: DC
  Filled 2020-08-29: qty 1

## 2020-08-29 MED ORDER — SODIUM CHLORIDE 0.9% FLUSH
3.0000 mL | INTRAVENOUS | Status: DC | PRN
  Administered 2020-08-31: 3 mL via INTRAVENOUS

## 2020-08-29 MED ORDER — SODIUM CHLORIDE 0.9 % IV SOLN: 250.0000 mL | INTRAVENOUS | Status: DC | PRN

## 2020-08-29 MED ORDER — HYDROMORPHONE HCL 1 MG/ML IJ SOLN: 1.0000 mg | Freq: Once | INTRAMUSCULAR | Status: DC | PRN

## 2020-08-29 MED ORDER — SODIUM CHLORIDE 0.9 % IV SOLN: INTRAVENOUS | Status: DC

## 2020-08-29 MED ORDER — SEVELAMER CARBONATE 800 MG PO TABS: 4000.0000 mg | ORAL_TABLET | Freq: Three times a day (TID) | ORAL | Status: DC

## 2020-08-29 MED ORDER — DIPHENHYDRAMINE HCL 50 MG/ML IJ SOLN
INTRAMUSCULAR | Status: AC
  Administered 2020-08-29: 50 mg via INTRAVENOUS
  Filled 2020-08-29: qty 1

## 2020-08-29 MED ORDER — ACETAMINOPHEN 325 MG PO TABS: 650.0000 mg | ORAL_TABLET | ORAL | Status: DC | PRN

## 2020-08-29 MED ORDER — HYDROXYZINE HCL 25 MG PO TABS: 25.0000 mg | ORAL_TABLET | Freq: Three times a day (TID) | ORAL | Status: DC | PRN

## 2020-08-29 MED ORDER — LIDOCAINE-PRILOCAINE 2.5-2.5 % EX CREA: TOPICAL_CREAM | Freq: Once | CUTANEOUS | Status: DC

## 2020-08-29 MED ORDER — ATORVASTATIN CALCIUM 80 MG PO TABS
80.0000 mg | ORAL_TABLET | Freq: Every day | ORAL | Status: DC
  Administered 2020-08-29 – 2020-08-31 (×3): 80 mg via ORAL
  Filled 2020-08-29: qty 1
  Filled 2020-08-29: qty 4
  Filled 2020-08-29 (×3): qty 1

## 2020-08-29 MED ORDER — METHYLPREDNISOLONE SODIUM SUCC 125 MG IJ SOLR: 125.0000 mg | Freq: Once | INTRAMUSCULAR | Status: AC | PRN

## 2020-08-29 MED ORDER — IODIXANOL 320 MG/ML IV SOLN
INTRAVENOUS | Status: DC | PRN
  Administered 2020-08-29: 60 mL

## 2020-08-29 MED ORDER — NITROGLYCERIN 0.4 MG SL SUBL: 0.4000 mg | SUBLINGUAL_TABLET | SUBLINGUAL | Status: DC | PRN

## 2020-08-29 MED ORDER — MIDAZOLAM HCL 2 MG/2ML IJ SOLN
INTRAMUSCULAR | Status: DC | PRN
  Administered 2020-08-29: 0.5 mg via INTRAVENOUS
  Administered 2020-08-29: 2 mg via INTRAVENOUS
  Administered 2020-08-29: 0.5 mg via INTRAVENOUS

## 2020-08-29 MED ORDER — CINACALCET HCL 30 MG PO TABS
60.0000 mg | ORAL_TABLET | ORAL | Status: DC
  Administered 2020-08-31: 60 mg via ORAL
  Filled 2020-08-29: qty 2

## 2020-08-29 MED ORDER — CLOPIDOGREL BISULFATE 75 MG PO TABS: 75.0000 mg | ORAL_TABLET | Freq: Every day | ORAL | Status: DC

## 2020-08-29 MED ORDER — FENTANYL CITRATE (PF) 100 MCG/2ML IJ SOLN
INTRAMUSCULAR | Status: DC | PRN
  Administered 2020-08-29: 50 ug via INTRAVENOUS

## 2020-08-29 MED ORDER — SODIUM ZIRCONIUM CYCLOSILICATE 10 G PO PACK: 10.0000 g | PACK | ORAL | Status: DC

## 2020-08-29 MED ORDER — HEPARIN SODIUM (PORCINE) 1000 UNIT/ML IJ SOLN
INTRAMUSCULAR | Status: DC | PRN
  Administered 2020-08-29: 4000 [IU] via INTRAVENOUS

## 2020-08-29 MED ORDER — SODIUM CHLORIDE 0.9% FLUSH: 3.0000 mL | Freq: Two times a day (BID) | INTRAVENOUS | Status: DC

## 2020-08-29 MED ORDER — SODIUM CHLORIDE 0.9% FLUSH: 3.0000 mL | INTRAVENOUS | Status: DC | PRN

## 2020-08-29 MED ORDER — ASPIRIN 81 MG PO TABS: 81.0000 mg | ORAL_TABLET | Freq: Every day | ORAL | Status: DC

## 2020-08-29 MED ORDER — OXYCODONE HCL 5 MG PO TABS
5.0000 mg | ORAL_TABLET | ORAL | Status: DC | PRN
  Administered 2020-08-30 – 2020-08-31 (×2): 5 mg via ORAL
  Filled 2020-08-29: qty 1

## 2020-08-29 MED ORDER — GABAPENTIN 300 MG PO CAPS
300.0000 mg | ORAL_CAPSULE | Freq: Every day | ORAL | Status: DC
  Administered 2020-08-29 – 2020-08-30 (×2): 600 mg via ORAL
  Administered 2020-08-31: 300 mg via ORAL
  Filled 2020-08-29 (×3): qty 2

## 2020-08-29 MED ORDER — CHLORHEXIDINE GLUCONATE CLOTH 2 % EX PADS
6.0000 | MEDICATED_PAD | Freq: Every day | CUTANEOUS | Status: DC
  Administered 2020-08-30: 6 via TOPICAL

## 2020-08-29 MED ORDER — INSULIN GLARGINE 100 UNIT/ML ~~LOC~~ SOLN
30.0000 [IU] | Freq: Every day | SUBCUTANEOUS | Status: DC
  Administered 2020-08-31: 30 [IU] via SUBCUTANEOUS
  Filled 2020-08-29 (×3): qty 0.3

## 2020-08-29 MED ORDER — SODIUM CHLORIDE 0.9% FLUSH
3.0000 mL | Freq: Two times a day (BID) | INTRAVENOUS | Status: DC
  Administered 2020-08-29 – 2020-09-01 (×6): 3 mL via INTRAVENOUS

## 2020-08-29 MED ORDER — MORPHINE SULFATE (PF) 4 MG/ML IV SOLN: 2.0000 mg | INTRAVENOUS | Status: DC | PRN

## 2020-08-29 MED ORDER — HYDRALAZINE HCL 20 MG/ML IJ SOLN: 5.0000 mg | INTRAMUSCULAR | Status: DC | PRN

## 2020-08-29 MED ORDER — SODIUM CHLORIDE 0.9 % IV SOLN
INTRAVENOUS | Status: AC
  Administered 2020-08-29: 1 g via INTRAVENOUS
  Filled 2020-08-29: qty 10

## 2020-08-29 MED ORDER — OXYCODONE HCL 5 MG PO TABS
5.0000 mg | ORAL_TABLET | ORAL | Status: DC | PRN
Start: 2020-08-29 — End: 2020-08-29

## 2020-08-29 MED ORDER — HEPARIN SODIUM (PORCINE) 1000 UNIT/ML IJ SOLN
INTRAMUSCULAR | Status: AC
  Filled 2020-08-29: qty 1

## 2020-08-29 MED ORDER — VITAMIN D 25 MCG (1000 UNIT) PO TABS
1000.0000 [IU] | ORAL_TABLET | Freq: Every day | ORAL | Status: DC
  Administered 2020-08-31 – 2020-09-01 (×2): 1000 [IU] via ORAL
  Filled 2020-08-29 (×2): qty 1

## 2020-08-29 MED ORDER — MORPHINE SULFATE (PF) 4 MG/ML IV SOLN
2.0000 mg | INTRAVENOUS | Status: DC | PRN
  Administered 2020-08-29: 2 mg via INTRAVENOUS
  Filled 2020-08-29: qty 1

## 2020-08-29 SURGICAL SUPPLY — 25 items
BALLN ARMADA 2.0X40X150 (BALLOONS) ×3
BALLN LUTONIX DCB 4X40X130 (BALLOONS) ×3
BALLN ULTRASCOR 014 2.5X40X150 (BALLOONS) ×3
BALLN ULTRVRSE 2X150X150 (BALLOONS) ×3
BALLN ULTRVRSE 2X40X150 (BALLOONS) ×3
BALLOON ARMADA 2.0X40X150 (BALLOONS) ×1 IMPLANT
BALLOON LUTONIX DCB 4X40X130 (BALLOONS) ×1 IMPLANT
BALLOON ULTRSCR 014 2.5X40X150 (BALLOONS) ×1 IMPLANT
BALLOON ULTRVRSE 2X150X150 (BALLOONS) ×1 IMPLANT
BALLOON ULTRVRSE 2X40X150 (BALLOONS) ×1 IMPLANT
CANNULA 5F STIFF (CANNULA) ×3 IMPLANT
CATH ANGIO 5F PIGTAIL 65CM (CATHETERS) ×3 IMPLANT
CATH SEEKER .018X150 (CATHETERS) ×3 IMPLANT
CATH VERT 5FR 125CM (CATHETERS) ×3 IMPLANT
COVER PROBE U/S 5X48 (MISCELLANEOUS) ×3 IMPLANT
DEVICE STARCLOSE SE CLOSURE (Vascular Products) ×3 IMPLANT
GLIDEWIRE ADV .014X300CM (WIRE) ×3 IMPLANT
GLIDEWIRE ADV .035X260CM (WIRE) ×3 IMPLANT
KIT ENCORE 26 ADVANTAGE (KITS) ×3 IMPLANT
PACK ANGIOGRAPHY (CUSTOM PROCEDURE TRAY) ×3 IMPLANT
SHEATH BRITE TIP 5FRX11 (SHEATH) ×3 IMPLANT
SHEATH RAABE 6FRX70 (SHEATH) ×3 IMPLANT
SYR MEDRAD MARK 7 150ML (SYRINGE) ×3 IMPLANT
TUBING CONTRAST HIGH PRESS 48 (TUBING) ×6 IMPLANT
WIRE GUIDERIGHT .035X150 (WIRE) ×3 IMPLANT

## 2020-08-29 NOTE — Consult Note (Signed)
ORTHOPAEDIC CONSULTATION  REQUESTING PHYSICIAN: Schnier, Dolores Lory, MD  Chief Complaint: Right third toe gangrene  HPI: Sally Lynch is a 65 y.o. female who complains of worsening gangrenous changes to her right third toe.  Underwent revascularization of the right lower extremity today.  Will be admitted for antibiotics.  I was consulted in regards to the gangrene to her third toe.  We have previously discussed excision of this as this has progressed quite quickly.  Past Medical History:  Diagnosis Date   Anginal pain ([place])    Arthritis    CHF (congestive heart failure) (HCC)    Chronic kidney disease    Coronary artery disease    Diabetes mellitus without complication (HCC)    GERD (gastroesophageal reflux disease)    Hypertension    Myocardial infarction [facility])    Past Surgical History:  Procedure Laterality Date   A/V FISTULAGRAM Left 01/06/2018   Procedure: A/V FISTULAGRAM;  Surgeon: Katha Cabal, MD;  Location: [place] [facility];  Service: Cardiovascular;  Laterality: Left;   AV FISTULA PLACEMENT  2011   CARDIAC CATHETERIZATION     EYE SURGERY     bilateral cararact surgery   PERIPHERAL VASCULAR CATHETERIZATION N/A 07/12/2015   Procedure: A/V Shuntogram/Fistulagram;  Surgeon: Katha Cabal, MD;  Location: [place] [facility];  Service: Cardiovascular;  Laterality: N/A;   Social History   Socioeconomic History   Marital status: Widowed    Spouse name: Not on file   Number of children: Not on file   Years of education: Not on file   Highest education level: Not on file  Occupational History   Not on file  Tobacco Use   Smoking status: Never   Smokeless tobacco: Never  Substance and Sexual Activity   Alcohol use: No   Drug use: No   Sexual activity: Never    Birth control/protection: Post-menopausal  Other Topics Concern   Not on file  Social History Narrative   Not on file   Social Determinants of Health   Financial Resource Strain: Not on  file  Food Insecurity: Not on file  Transportation Needs: Not on file  Physical Activity: Not on file  Stress: Not on file  Social Connections: Not on file   Family History  Problem Relation Age of Onset   Hypertension Mother    Diabetes Mother    Diabetes Father    Heart attack Father    Allergies  Allergen Reactions   Shellfish Allergy Hives   Contrast Media [Iodinated Diagnostic Agents] Hives   Furosemide Rash   Other Rash    Raw fruits   Peanut Oil Rash   Prior to Admission medications   Medication Sig Start Date End Date Taking? Authorizing Provider  acetaminophen (TYLENOL) 325 MG tablet Take 650 mg by mouth every 4 (four) hours as needed for moderate pain.   Yes [provider]  atorvastatin (LIPITOR) 80 MG tablet Take 80 mg by mouth daily.    Yes [provider]  calcitRIOL (ROCALTROL) 0.25 MCG capsule Take 2.25 mcg by mouth 3 (three) times a week.  10/20/17  Yes [provider]  cholecalciferol (VITAMIN D) 1000 units tablet Take 1,000 Units by mouth daily.   Yes [provider]  cinacalcet (SENSIPAR) 30 MG tablet Take 30 mg by mouth daily. Alternating with 60 mg   Yes [provider]  cinacalcet (SENSIPAR) 60 MG tablet Take 60 mg by mouth every other day. alternating with 30 mg  Yes [provider]  doxycycline (VIBRAMYCIN) 100 MG capsule Take 1 capsule (100 mg total) by mouth 2 (two) times daily. 02/23/20  Yes Margarette Canada, NP  gabapentin (NEURONTIN) 300 MG capsule Take 300-600 mg by mouth at bedtime.    Yes [provider]  insulin glargine (LANTUS) 100 UNIT/ML injection Inject 30 Units into the skin daily.    Yes [provider]  LOKELMA 10 g PACK packet Take by mouth once a week. 09/27/19  Yes [provider]  midodrine (PROAMATINE) 5 MG tablet Take 1 tab 1/2 hr before dialysis, take 1 tab mid treatment prn hypotension 05/22/16  Yes [provider]  mupirocin ointment (BACTROBAN) 2 %  Apply topically. 03/20/20  Yes [provider]  mupirocin ointment (BACTROBAN) 2 % Apply to right foot wound daily 04/27/20  Yes Schnier, Dolores Lory, MD  pantoprazole (PROTONIX) 40 MG tablet Take 40 mg by mouth daily.   Yes [provider]  sodium zirconium cyclosilicate (LOKELMA) 10 g PACK packet Take by mouth.   Yes [provider]  triamcinolone cream (KENALOG) 0.1 % Mix 1:1 w Eucerin cream.  Apply to dry itchy skin twice daily prn 10/26/13  Yes [provider]  VELPHORO 500 MG chewable tablet Chew by mouth. 08/30/19  Yes [provider]  aspirin 81 MG tablet Take 81 mg by mouth daily.     [provider]  clopidogrel (PLAVIX) 75 MG tablet Take 75 mg by mouth daily.     [provider]  hydrOXYzine (ATARAX/VISTARIL) 25 MG tablet Take 25 mg by mouth 3 (three) times daily as needed.    [provider]  Insulin Pen Needle (B-D UF III MINI PEN NEEDLES) 31G X 5 MM MISC Inject into the skin. 12/14/19   [provider]  lidocaine-prilocaine (EMLA) cream Apply topically. 07/19/17   [provider]  metoprolol succinate (TOPROL-XL) 25 MG 24 hr tablet Take 25 mg by mouth. Non-dialysis days Patient not taking: Reported on 08/29/2020    [provider]  moxifloxacin (VIGAMOX) 0.5 % ophthalmic solution Apply to eye. Patient not taking: Reported on 08/29/2020 11/17/19   [provider]  nitroGLYCERIN (NITROSTAT) 0.4 MG SL tablet Place 0.4 mg under the tongue every 5 (five) minutes as needed for chest pain.    [provider]  sevelamer carbonate (RENVELA) 800 MG tablet Take 4,000 mg by mouth 3 (three) times daily.  Patient not taking: Reported on 08/29/2020 09/10/13   [provider]  sodium polystyrene (KAYEXALATE) 15 GM/60ML suspension Take 20 g by mouth every other day.  Patient not taking: Reported on 08/29/2020    [provider]   PERIPHERAL VASCULAR CATHETERIZATION  Result Date:  08/29/2020 See surgical note for result.   Positive ROS: All other systems have been reviewed and were otherwise negative with the exception of those mentioned in the HPI and as above.  12 point ROS was performed.  Physical Exam: General: Alert and oriented.  No apparent distress.  Vascular:  Left foot:Dorsalis Pedis:  absent Posterior Tibial:  absent  Right foot: Dorsalis Pedis:  absent Posterior Tibial:  absent  Neuro:intact gross sensation.  Derm: Gangrenous changes to the right third toe distal to the MTPJ.  Mild erythema to the foot.  Foul odor.  No lymphangitic streaking.  Ortho/MS: Mild edema diffusely to the right foot.   Assessment: Gangrene right third toe Peripheral vascular disease right lower extremity  Plan: At this point she has obvious gangrenous changes that  have demarcated quite well to the right third toe.  I discussed amputation of the third toe at this point and she just underwent revascularization so this is an optimal time to perform the procedure.  I did discuss the risk benefits alternatives and complications associated with the surgery and consent has been given.  We will plan on surgery tomorrow.  Orders have been written.    Elesa Hacker, DPM Cell [phone]   08/29/2020 1:17 PM

## 2020-08-29 NOTE — Op Note (Signed)
[facility]  Percutaneous Study/Intervention Procedural Note   Date of Surgery: 08/29/2020  Surgeon:  Hortencia Pilar  Pre-operative Diagnosis: Atherosclerotic occlusive disease bilateral lower extremities with gangrenous changes of the right great toe and kissing ulcers between the second and third toes of the right lower extremity  Post-operative diagnosis:  Same  Procedure(s) Performed:             1.  Introduction catheter into right lower extremity 3rd order catheter placement               2.    Contrast injection right lower extremity for distal runoff             3.  Percutaneous transluminal angioplasty to 4 mm with a Lutonix balloon to the superficial femoral and popliteal arteries              4.  Percutaneous transluminal angioplasty to 2 mm right anterior tibial             5.  Percutaneous transluminal angioplasty to 2.5 mm right peroneal.             6.  Star close closure left common femoral arteriotomy  Anesthesia: Conscious sedation was administered under my direct supervision by the interventional radiology RN. IV Versed plus fentanyl were utilized. Continuous ECG, pulse oximetry and blood pressure was monitored throughout the entire procedure.  Conscious sedation was for a total of 1 hour 4 minutes 36 seconds.  Sheath: 6 [place] Raby left common femoral retrograde  Contrast: 60 cc  Fluoroscopy Time: 10.6 minutes  Indications:  Sally Lynch presents with increasing pain of the right lower extremity.  In the office she had a chronic great toe ulcer which has since converted to a gangrenous lesion she also had kissing ulcers between the second and third toes.  Noninvasive studies as well as physical examination demonstrated severe atherosclerotic occlusive disease.  This suggests the patient is having limb threatening ischemia. The risks and benefits are reviewed all questions answered patient agrees to proceed.  Procedure: Sally Lynch is a  65 y.o. y.o. female who was identified and appropriate procedural time out was performed.  The patient was then placed supine on the table and prepped and draped in the usual sterile fashion.    Ultrasound was placed in the sterile sleeve and the left groin was evaluated the left common femoral artery was echolucent and pulsatile indicating patency.  Image was recorded for the permanent record and under real-time visualization a microneedle was inserted into the common femoral artery followed by the microwire and then the micro-sheath.  A J-wire was then advanced through the micro-sheath and a  5 [place] sheath was then inserted over a J-wire. J-wire was then advanced and a 5 French pigtail catheter was positioned at the level of T12.  AP projection of the aorta was then obtained. Pigtail catheter was repositioned to above the bifurcation and a LAO view of the pelvis was obtained.  Subsequently a pigtail catheter with the stiff angle Glidewire was used to cross the aortic bifurcation the catheter wire were advanced down into the right distal external iliac artery. Oblique view of the femoral bifurcation was then obtained and subsequently the wire was reintroduced and the pigtail catheter negotiated into the SFA representing third order catheter placement. Distal runoff was then performed.  Diagnostic interpretation: The abdominal aorta is opacified with a bolus injection contrast.  There are diffuse atherosclerotic changes throughout the  aortoiliac system but there are no hemodynamically significant stenoses.  The aorta iliac arteries are widely patent.  Common and external iliac arteries are widely patent bilaterally.  The right common femoral demonstrates mild atherosclerotic changes.  The profunda femoris demonstrates diffuse atherosclerotic changes but is widely patent.  The superficial femoral artery demonstrates increasing atherosclerotic changes although there are no hemodynamically significant stenoses  in the proximal two thirds.  At [facility] canal in the distal SFA proximal popliteal there is a 70% stenosis that extends over a distance of approximately 40 mm.  Distal to this lesion the popliteal artery is diffusely diseased but patent and free of hemodynamically significant stenoses.  The trifurcation demonstrates profound atherosclerotic changes.  The origin of the AT is patent but within 20 to 30 mm of the origin it demonstrates a long segment occlusion.  This extends over approximately 100 mm.  The tibioperoneal trunk is patent but the proximal peroneal demonstrates a greater than 90% stenosis extending over the first 30 mm.  Distal to this lesion the peroneal is widely patent down to the ankle with significant collaterals contributing to the foot.  The posterior tibial is occluded at its origin it is reconstituted in its distal one third by collaterals from the peroneal.  5000 units of heparin was then given and allowed to circulate and a 6 [place] Rabie sheath was advanced up and over the bifurcation and positioned in the femoral artery  The advantage Glidewire were then negotiated through the lesion at Hunter's canal and down into the distal popliteal.  Hand injection contrast demonstrated the tibial anatomy in detail.  4 mm x 40 mm Lutonix drug-eluting balloon was used to angioplasty the distal SFA proximal popliteal.  Inflation was to 10 atm for 1 full minute.. Follow-up imaging demonstrated excellent patency with less than 10% residual stenosis.  The detector was then repositioned and the trifurcation was reimaged and the trifurcation stenosis is roadmap.  Using a 0.014 advantage wire the lesion in the peroneal is crossed.  Initially a 2 mm x 40 mm Ultraverse balloon is used to treat the peroneal inflations to 10 atm for 1 minute.  Next a 2.5 mm x 40 mm ultra score balloon is advanced across this lesion inflated to 10 atm for 1 minute.  Follow-up imaging of the peroneal now demonstrates less than  10% residual stenosis with preservation of the distal outflow.  Attention is then turned to the anterior tibial.  A 0.018 seeker catheter is then advanced over the wire and the 014 advantage wire with a seeker catheter was negotiated into the anterior tibial.  Using a combination of the catheter wire the 100 mm occlusion of the anterior tibial was crossed.  A combination of a 2 mm x 100 mm Ultraverse as well as a 2 mm x 40 mm [place] balloon was then used to treat this lesion.  Both inflations were to 10 atm for 1 full minute.  Follow-up imaging now demonstrated recanalization of the anterior tibial with less than 10% residual stenosis and preservation of the distal runoff to the foot.  After review of these images the sheath is pulled into the left external iliac oblique of the common femoral is obtained and a Star close device deployed. There no immediate Complications.  Findings:   The abdominal aorta is opacified with a bolus injection contrast.  There are diffuse atherosclerotic changes throughout the aortoiliac system but there are no hemodynamically significant stenoses.  The aorta iliac arteries are widely patent.  Common and external iliac arteries are widely patent bilaterally.  The right common femoral demonstrates mild atherosclerotic changes.  The profunda femoris demonstrates diffuse atherosclerotic changes but is widely patent.  The superficial femoral artery demonstrates increasing atherosclerotic changes although there are no hemodynamically significant stenoses in the proximal two thirds.  At [facility] canal in the distal SFA proximal popliteal there is a 70% stenosis that extends over a distance of approximately 40 mm.  Distal to this lesion the popliteal artery is diffusely diseased but patent and free of hemodynamically significant stenoses.  The trifurcation demonstrates profound atherosclerotic changes.  The origin of the AT is patent but within 20 to 30 mm of the origin it demonstrates a  long segment occlusion.  This extends over approximately 100 mm.  The tibioperoneal trunk is patent but the proximal peroneal demonstrates a greater than 90% stenosis extending over the first 30 mm.  Distal to this lesion the peroneal is widely patent down to the ankle with significant collaterals contributing to the foot.  The posterior tibial is occluded at its origin it is reconstituted in its distal one third by collaterals from the peroneal.  Following angioplasty of the right anterior tibial now is in-line flow and looks quite nice with less than 10% residual stenosis.  Similarly angioplasty of the right peroneal yields an excellent result with less than 10% residual stenosis.  Angioplasty of the right SFA at Hunter's canal yields an excellent result with less than 10% residual stenosis.  Summary: Successful recanalization right lower extremity for limb salvage                           Disposition: Patient was taken to the recovery room in stable condition having tolerated the procedure well.  Belenda Cruise Kutter Schnepf 08/29/2020,11:14 AM

## 2020-08-29 NOTE — Plan of Care (Signed)

## 2020-08-29 NOTE — Interval H&P Note (Signed)
History and Physical Interval Note:  08/29/2020 9:26 AM  Sally Lynch  has presented today for surgery, with the diagnosis of RT Lower Extremity Angiography   ASO with ulceration   BARD Rep cc: S Willey    CONTRAST ALLERGY.  The various methods of treatment have been discussed with the patient and family. After consideration of risks, benefits and other options for treatment, the patient has consented to  Procedure(s): LOWER EXTREMITY ANGIOGRAPHY (Right) as a surgical intervention.  The patient's history has been reviewed, patient examined, no change in status, stable for surgery.  I have reviewed the patient's chart and labs.  Questions were answered to the patient's satisfaction.     Hortencia Pilar

## 2020-08-29 NOTE — Consult Note (Signed)
[facility]  CONSULT NOTE    Date: 08/29/2020                  Patient Name:  Sally Lynch  MRN: [id number]  DOB: 10/12/1955  Age / Sex: 65 y.o., female         PCP: Rogelia Rohrer, MD                 Service Requesting Consult: Vascular                 Reason for Consult: ESRD            History of Present Illness: [name]  female with CHF, CAD, hypertension, MI, diabetes, and ESRD on dialysis, who was admitted to [facility] on 08/29/2020 for scheduled procedure. Patient underwent angiogram on 08/29/20 and is was determined an amputation is needed.  We have been consulted to manage dialysis needs during this admission. Patient seen with laying in bed eating lunch. States she requested to Dr Sally Lynch about circulation concerns in her legs. They voiced concerns about her toe. She states her toe was fine until they mentioned it. The toes blistered within a couple days and began to turn black. Denies any sores or wounds to feet. Says they usually don't swell. Denies nausea and vomiting. Denies shortness of breath. Received her usual treatment yesterday at outpatient clinic. No concerns with that treatment.    Medications: Outpatient medications: Medications Prior to Admission  Medication Sig Dispense Refill Last Dose   acetaminophen (TYLENOL) 325 MG tablet Take 650 mg by mouth every 4 (four) hours as needed for moderate pain.   08/28/2020   atorvastatin (LIPITOR) 80 MG tablet Take 80 mg by mouth daily.    08/28/2020   calcitRIOL (ROCALTROL) 0.25 MCG capsule Take 2.25 mcg by mouth 3 (three) times a week.    08/28/2020   cholecalciferol (VITAMIN D) 1000 units tablet Take 1,000 Units by mouth daily.   08/28/2020   cinacalcet (SENSIPAR) 30 MG tablet Take 30 mg by mouth daily. Alternating with 60 mg   Past Week   cinacalcet (SENSIPAR) 60 MG tablet Take 60 mg by mouth every other day. alternating with 30 mg   08/28/2020   doxycycline (VIBRAMYCIN) 100 MG  capsule Take 1 capsule (100 mg total) by mouth 2 (two) times daily. 20 capsule 0 08/28/2020   gabapentin (NEURONTIN) 300 MG capsule Take 300-600 mg by mouth at bedtime.    08/28/2020   insulin glargine (LANTUS) 100 UNIT/ML injection Inject 30 Units into the skin daily.    08/28/2020   LOKELMA 10 g PACK packet Take by mouth once a week.   08/28/2020   midodrine (PROAMATINE) 5 MG tablet Take 1 tab 1/2 hr before dialysis, take 1 tab mid treatment prn hypotension   Past Week   mupirocin ointment (BACTROBAN) 2 % Apply topically.   Past Week   mupirocin ointment (BACTROBAN) 2 % Apply to right foot wound daily 22 g 0 Past Week   pantoprazole (PROTONIX) 40 MG tablet Take 40 mg by mouth daily.   08/28/2020   sodium zirconium cyclosilicate (LOKELMA) 10 g PACK packet Take by mouth.   08/28/2020   triamcinolone cream (KENALOG) 0.1 % Mix 1:1 w Eucerin cream.  Apply to dry itchy skin twice daily prn   08/28/2020   VELPHORO 500 MG chewable tablet Chew by mouth.   08/28/2020   aspirin 81 MG tablet Take 81  mg by mouth daily.    Not Taking   clopidogrel (PLAVIX) 75 MG tablet Take 75 mg by mouth daily.    Not Taking   hydrOXYzine (ATARAX/VISTARIL) 25 MG tablet Take 25 mg by mouth 3 (three) times daily as needed.   05/09/2020   Insulin Pen Needle (B-D UF III MINI PEN NEEDLES) 31G X 5 MM MISC Inject into the skin.      lidocaine-prilocaine (EMLA) cream Apply topically.      metoprolol succinate (TOPROL-XL) 25 MG 24 hr tablet Take 25 mg by mouth. Non-dialysis days (Patient not taking: Reported on 08/29/2020)   Not Taking   moxifloxacin (VIGAMOX) 0.5 % ophthalmic solution Apply to eye. (Patient not taking: Reported on 08/29/2020)   Not Taking   nitroGLYCERIN (NITROSTAT) 0.4 MG SL tablet Place 0.4 mg under the tongue every 5 (five) minutes as needed for chest pain.   03/28/2020   sevelamer carbonate (RENVELA) 800 MG tablet Take 4,000 mg by mouth 3 (three) times daily.  (Patient not taking: Reported on 08/29/2020)   Not Taking    sodium polystyrene (KAYEXALATE) 15 GM/60ML suspension Take 20 g by mouth every other day.  (Patient not taking: Reported on 08/29/2020)   Not Taking    Current medications: Current Facility-Administered Medications  Medication Dose Route Frequency Provider Last Rate Last Admin   0.9 %  sodium chloride infusion  250 mL Intravenous PRN Schnier, Dolores Lory, MD       acetaminophen (TYLENOL) tablet 650 mg  650 mg Oral Q4H PRN Schnier, Dolores Lory, MD       aspirin tablet 81 mg  81 mg Oral Daily Schnier, Dolores Lory, MD       atorvastatin (LIPITOR) tablet 80 mg  80 mg Oral Daily Schnier, Dolores Lory, MD       [START ON 08/30/2020] calcitRIOL (ROCALTROL) capsule 2.25 mcg  2.25 mcg Oral Once per day on Mon Wed Fri Schnier, Dolores Lory, MD       [START ON 08/30/2020] Chlorhexidine Gluconate Cloth 2 % PADS 6 each  6 each Topical Q0600 Sally Flattery, NP       cinacalcet (SENSIPAR) tablet 30 mg  30 mg Oral Daily Schnier, Dolores Lory, MD       cinacalcet (SENSIPAR) tablet 60 mg  60 mg Oral QODAY Schnier, Dolores Lory, MD       clopidogrel (PLAVIX) tablet 75 mg  75 mg Oral Daily Schnier, Dolores Lory, MD       doxycycline (VIBRAMYCIN) capsule 100 mg  100 mg Oral BID Schnier, Dolores Lory, MD       fentaNYL (SUBLIMAZE) 100 MCG/2ML injection            gabapentin (NEURONTIN) capsule 300-600 mg  300-600 mg Oral QHS Schnier, Dolores Lory, MD       heparin sodium (porcine) 1000 UNIT/ML injection            hydrOXYzine (ATARAX/VISTARIL) tablet 25 mg  25 mg Oral TID PRN Schnier, Dolores Lory, MD       insulin glargine (LANTUS) injection 30 Units  30 Units Subcutaneous Daily Schnier, Dolores Lory, MD       lidocaine-prilocaine (EMLA) cream   Topical Once Schnier, Dolores Lory, MD       metoprolol succinate (TOPROL-XL) 24 hr tablet 25 mg  25 mg Oral Daily Schnier, Dolores Lory, MD       midazolam (VERSED) 5 MG/5ML injection            midodrine (PROAMATINE) tablet  5 mg  5 mg Oral BID WC Schnier, Dolores Lory, MD       morphine 4 MG/ML injection 2 mg   2 mg Intravenous Q1H PRN Schnier, Dolores Lory, MD       nitroGLYCERIN (NITROSTAT) SL tablet 0.4 mg  0.4 mg Sublingual Q5 min PRN Schnier, Dolores Lory, MD       ondansetron [facility]) injection 4 mg  4 mg Intravenous Q6H PRN Schnier, Dolores Lory, MD       oxyCODONE (Oxy IR/ROXICODONE) immediate release tablet 5-10 mg  5-10 mg Oral Q4H PRN Schnier, Dolores Lory, MD       pantoprazole (PROTONIX) EC tablet 40 mg  40 mg Oral Daily Schnier, Dolores Lory, MD       sevelamer carbonate (RENVELA) tablet 4,000 mg  4,000 mg Oral TID Schnier, Dolores Lory, MD       sodium chloride flush (NS) 0.9 % injection 3 mL  3 mL Intravenous Q12H Schnier, Dolores Lory, MD       sodium chloride flush (NS) 0.9 % injection 3 mL  3 mL Intravenous PRN Schnier, Dolores Lory, MD       sodium zirconium cyclosilicate (LOKELMA) packet 10 g  10 g Oral Weekly Schnier, Dolores Lory, MD       sodium zirconium cyclosilicate (LOKELMA) packet 10 g  10 g Oral Daily Schnier, Dolores Lory, MD       sucroferric oxyhydroxide [facility]) chewable tablet 500 mg  500 mg Oral TID WC Schnier, Dolores Lory, MD       Vitamin D3 (Vitamin D) tablet 1,000 Units  1,000 Units Oral Daily Schnier, Dolores Lory, MD          Allergies: Allergies  Allergen Reactions   Shellfish Allergy Hives   Contrast Media [Iodinated Diagnostic Agents] Hives   Furosemide Rash   Other Rash    Raw fruits   Peanut Oil Rash      Past Medical History: Past Medical History:  Diagnosis Date   Anginal pain ([place])    Arthritis    CHF (congestive heart failure) (HCC)    Chronic kidney disease    Coronary artery disease    Diabetes mellitus without complication (HCC)    GERD (gastroesophageal reflux disease)    Hypertension    Myocardial infarction [facility])      Past Surgical History: Past Surgical History:  Procedure Laterality Date   A/V FISTULAGRAM Left 01/06/2018   Procedure: A/V FISTULAGRAM;  Surgeon: Katha Cabal, MD;  Location: [facility];  Service: Cardiovascular;   Laterality: Left;   AV FISTULA PLACEMENT  2011   CARDIAC CATHETERIZATION     EYE SURGERY     bilateral cararact surgery   PERIPHERAL VASCULAR CATHETERIZATION N/A 07/12/2015   Procedure: A/V Shuntogram/Fistulagram;  Surgeon: Katha Cabal, MD;  Location: [facility];  Service: Cardiovascular;  Laterality: N/A;     Family History: Family History  Problem Relation Age of Onset   Hypertension Mother    Diabetes Mother    Diabetes Father    Heart attack Father      Social History: Social History   Socioeconomic History   Marital status: Widowed    Spouse name: Not on file   Number of children: Not on file   Years of education: Not on file   Highest education level: Not on file  Occupational History   Not on file  Tobacco Use   Smoking status: Never   Smokeless tobacco: Never  Substance and Sexual Activity   Alcohol use: No   Drug use: No   Sexual activity: Never    Birth control/protection: Post-menopausal  Other Topics Concern   Not on file  Social History Narrative   Not on file   Social Determinants of Health   Financial Resource Strain: Not on file  Food Insecurity: Not on file  Transportation Needs: Not on file  Physical Activity: Not on file  Stress: Not on file  Social Connections: Not on file  Intimate Partner Violence: Not on file     Review of Systems: Review of Systems  Cardiovascular:  Positive for leg swelling.  All other systems reviewed and are negative.  Vital Signs: Blood pressure 118/67, pulse 74, temperature 98.8 F (37.1 C), temperature source Oral, resp. rate 18, height '5\' 4"'$  (1.626 m), weight 85 kg, SpO2 92 %.  Weight trends: Filed Weights   08/29/20 0921  Weight: 85 kg    Physical Exam: General: NAD, laying on stretcher eating lunch  Head: Normocephalic, atraumatic. Moist oral mucosal membranes  Eyes: Anicteric  Lungs:  Clear to auscultation, normal effort  Heart: Regular rate and rhythm  Abdomen:  Soft,  nontender  Extremities:  1+ peripheral edema.  Neurologic: Nonfocal, moving all four extremities  Skin: No lesions  Access: Lt AVF     Lab results: Basic Metabolic Panel: No results for input(s): NA, K, CL, CO2, GLUCOSE, BUN, CREATININE, CALCIUM, MG, PHOS in the last 168 hours.  Liver Function Tests: No results for input(s): AST, ALT, ALKPHOS, BILITOT, PROT, ALBUMIN in the last 168 hours. No results for input(s): LIPASE, AMYLASE in the last 168 hours. No results for input(s): AMMONIA in the last 168 hours.  CBC: No results for input(s): WBC, NEUTROABS, HGB, HCT, MCV, PLT in the last 168 hours.  Cardiac Enzymes: No results for input(s): CKTOTAL, CKMB, CKMBINDEX, TROPONINI in the last 168 hours.  BNP: Invalid input(s): POCBNP  CBG: Recent Labs  Lab 08/29/20 0921 08/29/20 1125  GLUCAP 93 83    Microbiology: Results for orders placed or performed during the hospital encounter of 02/23/20  Aerobic Culture  (superficial specimen)     Status: None   Collection Time: 02/23/20 12:50 PM   Specimen: Toe  Result Value Ref Range Status   Specimen Description   Final    TOE Performed at [facility], [address], [place], [place] [address]    Special Requests   Final    ABCESS Performed at [facility] [address] [place], [place] [address]    Gram Stain   Final    FEW WBC PRESENT, PREDOMINANTLY PMN RARE GRAM POSITIVE COCCI Performed at [facility], [place] [address], [place], [place] [address]    Culture FEW STAPHYLOCOCCUS LUGDUNENSIS  Final   Report Status 02/26/2020 FINAL  Final   Organism ID, Bacteria STAPHYLOCOCCUS LUGDUNENSIS  Final      Susceptibility   Staphylococcus lugdunensis - MIC*    CIPROFLOXACIN <=0.5 SENSITIVE Sensitive     ERYTHROMYCIN <=0.25 SENSITIVE Sensitive     GENTAMICIN <=0.5 SENSITIVE Sensitive     OXACILLIN 2 SENSITIVE Sensitive     TETRACYCLINE <=1 SENSITIVE Sensitive     VANCOMYCIN <=0.5 SENSITIVE  Sensitive     TRIMETH/SULFA <=10 SENSITIVE Sensitive     CLINDAMYCIN <=0.25 SENSITIVE Sensitive     RIFAMPIN <=0.5 SENSITIVE Sensitive     Inducible Clindamycin NEGATIVE Sensitive     * FEW STAPHYLOCOCCUS LUGDUNENSIS  Coagulation Studies: No results for input(s): LABPROT, INR in the last 72 hours.  Urinalysis: No results for input(s): COLORURINE, LABSPEC, PHURINE, GLUCOSEU, HGBUR, BILIRUBINUR, KETONESUR, PROTEINUR, UROBILINOGEN, NITRITE, LEUKOCYTESUR in the last 72 hours.  Invalid input(s): APPERANCEUR    Imaging: PERIPHERAL VASCULAR CATHETERIZATION  Result Date: 08/29/2020 See surgical note for result.    Assessment & Plan: Ms. BREEANNE CATALANOTTO is a 65 y.o.  female with with CHF, CAD, hypertension, MI, diabetes, and ESRD on dialysis, who was admitted to [facility] on 08/29/2020 for scheduled procedure. Patient underwent angiogram on 08/29/20 and is was determined a metatarsal amputation is needed.   [facility]/MWF/Lt AVF  End Stage Renal Disease on hemodialysis:  Will maintain outpatient schedule during admission Will dialyze today to prepare for surgery tomorrow Next treatment day will be determined after surgery tomorrow  2. Anemia of chronic kidney disease  Lab Results  Component Value Date   HGB 10.7 (L) 06/14/2019   Hgb at goal Will monitor  3.Secondary Hyperparathyroidism:   Lab Results  Component Value Date   CALCIUM 8.6 (L) 06/14/2019   Cinacalcet and Calcitriol scheduled Renvela and Velphoro with meals  4. Diabetes mellitus type II with chronic kidney disease insulin dependent. Home regimen includes Lantus. Most recent hemoglobin A1c is 8.7 on 06/14/19.  Stable       LOS: 0 Raushanah Osmundson 6/14/20222:35 PM

## 2020-08-30 ENCOUNTER — Encounter: Payer: Self-pay | Admitting: Vascular Surgery

## 2020-08-30 ENCOUNTER — Observation Stay: Payer: Medicare Other | Admitting: Certified Registered"

## 2020-08-30 ENCOUNTER — Encounter
Admission: RE | Disposition: A | Discharge status: Home Health | Payer: Self-pay | Source: Home / Self Care | Attending: Vascular Surgery

## 2020-08-30 DIAGNOSIS — Z91041 Radiographic dye allergy status: Secondary | ICD-10-CM | POA: Diagnosis not present

## 2020-08-30 DIAGNOSIS — I252 Old myocardial infarction: Secondary | ICD-10-CM | POA: Diagnosis not present

## 2020-08-30 DIAGNOSIS — E782 Mixed hyperlipidemia: Secondary | ICD-10-CM | POA: Diagnosis present

## 2020-08-30 DIAGNOSIS — N186 End stage renal disease: Secondary | ICD-10-CM | POA: Diagnosis present

## 2020-08-30 DIAGNOSIS — I70261 Atherosclerosis of native arteries of extremities with gangrene, right leg: Secondary | ICD-10-CM | POA: Diagnosis present

## 2020-08-30 DIAGNOSIS — Z833 Family history of diabetes mellitus: Secondary | ICD-10-CM | POA: Diagnosis not present

## 2020-08-30 DIAGNOSIS — Z91018 Allergy to other foods: Secondary | ICD-10-CM | POA: Diagnosis not present

## 2020-08-30 DIAGNOSIS — I459 Conduction disorder, unspecified: Secondary | ICD-10-CM | POA: Diagnosis present

## 2020-08-30 DIAGNOSIS — N2581 Secondary hyperparathyroidism of renal origin: Secondary | ICD-10-CM | POA: Diagnosis present

## 2020-08-30 DIAGNOSIS — D631 Anemia in chronic kidney disease: Secondary | ICD-10-CM | POA: Diagnosis present

## 2020-08-30 DIAGNOSIS — I959 Hypotension, unspecified: Secondary | ICD-10-CM | POA: Diagnosis not present

## 2020-08-30 DIAGNOSIS — Z8249 Family history of ischemic heart disease and other diseases of the circulatory system: Secondary | ICD-10-CM | POA: Diagnosis not present

## 2020-08-30 DIAGNOSIS — I70235 Atherosclerosis of native arteries of right leg with ulceration of other part of foot: Secondary | ICD-10-CM | POA: Diagnosis present

## 2020-08-30 DIAGNOSIS — I509 Heart failure, unspecified: Secondary | ICD-10-CM | POA: Diagnosis present

## 2020-08-30 DIAGNOSIS — K219 Gastro-esophageal reflux disease without esophagitis: Secondary | ICD-10-CM | POA: Diagnosis present

## 2020-08-30 DIAGNOSIS — M1990 Unspecified osteoarthritis, unspecified site: Secondary | ICD-10-CM | POA: Diagnosis present

## 2020-08-30 DIAGNOSIS — Z992 Dependence on renal dialysis: Secondary | ICD-10-CM | POA: Diagnosis not present

## 2020-08-30 DIAGNOSIS — Z91013 Allergy to seafood: Secondary | ICD-10-CM | POA: Diagnosis not present

## 2020-08-30 DIAGNOSIS — I25118 Atherosclerotic heart disease of native coronary artery with other forms of angina pectoris: Secondary | ICD-10-CM | POA: Diagnosis present

## 2020-08-30 DIAGNOSIS — Z888 Allergy status to other drugs, medicaments and biological substances status: Secondary | ICD-10-CM | POA: Diagnosis not present

## 2020-08-30 DIAGNOSIS — E1152 Type 2 diabetes mellitus with diabetic peripheral angiopathy with gangrene: Secondary | ICD-10-CM | POA: Diagnosis present

## 2020-08-30 DIAGNOSIS — I132 Hypertensive heart and chronic kidney disease with heart failure and with stage 5 chronic kidney disease, or end stage renal disease: Secondary | ICD-10-CM | POA: Diagnosis present

## 2020-08-30 DIAGNOSIS — I442 Atrioventricular block, complete: Secondary | ICD-10-CM | POA: Diagnosis present

## 2020-08-30 DIAGNOSIS — E1122 Type 2 diabetes mellitus with diabetic chronic kidney disease: Secondary | ICD-10-CM | POA: Diagnosis present

## 2020-08-30 DIAGNOSIS — E11621 Type 2 diabetes mellitus with foot ulcer: Secondary | ICD-10-CM | POA: Diagnosis present

## 2020-08-30 HISTORY — PX: AMPUTATION TOE: SHX6595

## 2020-08-30 LAB — HEMOGLOBIN A1C
Hgb A1c MFr Bld: 10.9 % — ABNORMAL HIGH (ref 4.8–5.6)
Mean Plasma Glucose: 266 mg/dL

## 2020-08-30 LAB — GLUCOSE, CAPILLARY
Glucose-Capillary: 256 mg/dL — ABNORMAL HIGH (ref 70–99)
Glucose-Capillary: 215 mg/dL — ABNORMAL HIGH (ref 70–99)
Glucose-Capillary: 253 mg/dL — ABNORMAL HIGH (ref 70–99)
Glucose-Capillary: 181 mg/dL — ABNORMAL HIGH (ref 70–99)
Glucose-Capillary: 183 mg/dL — ABNORMAL HIGH (ref 70–99)
Glucose-Capillary: 94 mg/dL (ref 70–99)

## 2020-08-30 LAB — BASIC METABOLIC PANEL
Anion gap: 13 (ref 5–15)
BUN: 30 mg/dL — ABNORMAL HIGH (ref 8–23)
CO2: 29 mmol/L (ref 22–32)
Calcium: 8.3 mg/dL — ABNORMAL LOW (ref 8.9–10.3)
Chloride: 96 mmol/L — ABNORMAL LOW (ref 98–111)
Creatinine, Ser: 5.5 mg/dL — ABNORMAL HIGH (ref 0.44–1.00)
GFR, Estimated: 8 mL/min — ABNORMAL LOW (ref >60)
Glucose, Bld: 319 mg/dL — ABNORMAL HIGH (ref 70–99)
Potassium: 4.6 mmol/L (ref 3.5–5.1)
Sodium: 138 mmol/L (ref 135–145)

## 2020-08-30 LAB — HEPATITIS B SURFACE ANTIGEN: Hepatitis B Surface Ag: NONREACTIVE

## 2020-08-30 SURGERY — AMPUTATION, TOE
Anesthesia: General | Site: Toe | Laterality: Right

## 2020-08-30 MED ORDER — EPHEDRINE SULFATE 50 MG/ML IJ SOLN
INTRAMUSCULAR | Status: DC | PRN
  Administered 2020-08-30 (×2): 10 mg via INTRAVENOUS

## 2020-08-30 MED ORDER — SODIUM CHLORIDE 0.9 % IV SOLN: 100.0000 mL | INTRAVENOUS | Status: DC | PRN

## 2020-08-30 MED ORDER — INSULIN ASPART 100 UNIT/ML IJ SOLN
4.0000 [IU] | Freq: Three times a day (TID) | INTRAMUSCULAR | Status: DC
  Administered 2020-08-30 – 2020-09-01 (×4): 4 [IU] via SUBCUTANEOUS
  Filled 2020-08-30 (×4): qty 1

## 2020-08-30 MED ORDER — PROPOFOL 500 MG/50ML IV EMUL
INTRAVENOUS | Status: DC | PRN
  Administered 2020-08-30: 125 ug/kg/min via INTRAVENOUS

## 2020-08-30 MED ORDER — 0.9 % SODIUM CHLORIDE (POUR BTL) OPTIME
TOPICAL | Status: DC | PRN
  Administered 2020-08-30: 100 mL

## 2020-08-30 MED ORDER — BUPIVACAINE HCL 0.5 % IJ SOLN
INTRAMUSCULAR | Status: DC | PRN
  Administered 2020-08-30: 5 mL

## 2020-08-30 MED ORDER — LIDOCAINE HCL (PF) 1 % IJ SOLN
5.0000 mL | INTRAMUSCULAR | Status: DC | PRN
  Filled 2020-08-30: qty 5

## 2020-08-30 MED ORDER — LIDOCAINE HCL (PF) 1 % IJ SOLN
INTRAMUSCULAR | Status: DC | PRN
  Administered 2020-08-30: 5 mL

## 2020-08-30 MED ORDER — ATROPINE SULFATE 1 MG/ML IJ SOLN
INTRAMUSCULAR | Status: DC | PRN
  Administered 2020-08-30: 1 mg via INTRAVENOUS

## 2020-08-30 MED ORDER — BUPIVACAINE HCL (PF) 0.5 % IJ SOLN
INTRAMUSCULAR | Status: AC
  Filled 2020-08-30: qty 30

## 2020-08-30 MED ORDER — ALTEPLASE 2 MG IJ SOLR: 2.0000 mg | Freq: Once | INTRAMUSCULAR | Status: DC | PRN

## 2020-08-30 MED ORDER — LACTATED RINGERS IV SOLN: INTRAVENOUS | Status: DC | PRN

## 2020-08-30 MED ORDER — PROPOFOL 10 MG/ML IV BOLUS
INTRAVENOUS | Status: AC
  Filled 2020-08-30: qty 20

## 2020-08-30 MED ORDER — INSULIN ASPART 100 UNIT/ML IJ SOLN
0.0000 [IU] | Freq: Three times a day (TID) | INTRAMUSCULAR | Status: DC
  Administered 2020-08-30: 3 [IU] via SUBCUTANEOUS
  Administered 2020-08-31: 5 [IU] via SUBCUTANEOUS
  Filled 2020-08-30 (×2): qty 1

## 2020-08-30 MED ORDER — HEPARIN SODIUM (PORCINE) 1000 UNIT/ML DIALYSIS
1000.0000 [IU] | INTRAMUSCULAR | Status: DC | PRN
  Filled 2020-08-30: qty 1

## 2020-08-30 MED ORDER — LIDOCAINE HCL (PF) 2 % IJ SOLN
INTRAMUSCULAR | Status: AC
  Filled 2020-08-30: qty 5

## 2020-08-30 MED ORDER — PENTAFLUOROPROP-TETRAFLUOROETH EX AERO
1.0000 "application " | INHALATION_SPRAY | CUTANEOUS | Status: DC | PRN
  Filled 2020-08-30: qty 30

## 2020-08-30 MED ORDER — FENTANYL CITRATE (PF) 100 MCG/2ML IJ SOLN
INTRAMUSCULAR | Status: AC
  Filled 2020-08-30: qty 2

## 2020-08-30 MED ORDER — FENTANYL CITRATE (PF) 100 MCG/2ML IJ SOLN
INTRAMUSCULAR | Status: DC | PRN
  Administered 2020-08-30: 50 ug via INTRAVENOUS

## 2020-08-30 MED ORDER — FENTANYL CITRATE (PF) 100 MCG/2ML IJ SOLN: 25.0000 ug | INTRAMUSCULAR | Status: DC | PRN

## 2020-08-30 MED ORDER — LIDOCAINE HCL (PF) 1 % IJ SOLN
INTRAMUSCULAR | Status: AC
  Filled 2020-08-30: qty 30

## 2020-08-30 MED ORDER — LIDOCAINE-PRILOCAINE 2.5-2.5 % EX CREA
1.0000 "application " | TOPICAL_CREAM | CUTANEOUS | Status: DC | PRN
  Filled 2020-08-30: qty 5

## 2020-08-30 MED ORDER — OXYCODONE HCL 5 MG PO TABS
ORAL_TABLET | ORAL | Status: AC
  Filled 2020-08-30: qty 1

## 2020-08-30 MED ORDER — POVIDONE-IODINE 10 % EX SWAB
2.0000 "application " | Freq: Once | CUTANEOUS | Status: AC
  Administered 2020-08-30: 2 via TOPICAL

## 2020-08-30 MED ORDER — PROPOFOL 500 MG/50ML IV EMUL
INTRAVENOUS | Status: AC
  Filled 2020-08-30: qty 50

## 2020-08-30 MED ORDER — LIDOCAINE HCL (CARDIAC) PF 100 MG/5ML IV SOSY
PREFILLED_SYRINGE | INTRAVENOUS | Status: DC | PRN
  Administered 2020-08-30: 60 mg via INTRAVENOUS

## 2020-08-30 MED ORDER — ONDANSETRON HCL 4 MG/2ML IJ SOLN: 4.0000 mg | Freq: Once | INTRAMUSCULAR | Status: DC | PRN

## 2020-08-30 MED ORDER — CHLORHEXIDINE GLUCONATE 0.12 % MT SOLN
OROMUCOSAL | Status: AC
  Administered 2020-08-30: 15 mL via OROMUCOSAL
  Filled 2020-08-30: qty 15

## 2020-08-30 MED ORDER — CHLORHEXIDINE GLUCONATE 0.12 % MT SOLN: 15.0000 mL | Freq: Once | OROMUCOSAL | Status: AC

## 2020-08-30 MED ORDER — GLYCOPYRROLATE 0.2 MG/ML IJ SOLN
INTRAMUSCULAR | Status: DC | PRN
  Administered 2020-08-30: .2 mg via INTRAVENOUS

## 2020-08-30 MED ORDER — ATROPINE SULFATE 1 MG/10ML IJ SOSY
PREFILLED_SYRINGE | INTRAMUSCULAR | Status: AC
  Filled 2020-08-30: qty 10

## 2020-08-30 MED ORDER — PROPOFOL 10 MG/ML IV BOLUS
INTRAVENOUS | Status: DC | PRN
  Administered 2020-08-30: 40 mg via INTRAVENOUS

## 2020-08-30 MED ORDER — CHLORHEXIDINE GLUCONATE 4 % EX LIQD: 60.0000 mL | Freq: Once | CUTANEOUS | Status: DC

## 2020-08-30 SURGICAL SUPPLY — 44 items
BLADE OSC/SAGITTAL MD 5.5X18 (BLADE) ×3 IMPLANT
BLADE SURG MINI STRL (BLADE) ×3 IMPLANT
BNDG CONFORM 2 STRL LF (GAUZE/BANDAGES/DRESSINGS) ×3 IMPLANT
BNDG CONFORM 3 STRL LF (GAUZE/BANDAGES/DRESSINGS) ×6 IMPLANT
BNDG ELASTIC 4X5.8 VLCR NS LF (GAUZE/BANDAGES/DRESSINGS) ×3 IMPLANT
BNDG ESMARK 4X12 TAN STRL LF (GAUZE/BANDAGES/DRESSINGS) ×3 IMPLANT
BNDG GAUZE 4.5X4.1 6PLY STRL (MISCELLANEOUS) ×3 IMPLANT
CANISTER SUCT 1200ML W/VALVE (MISCELLANEOUS) IMPLANT
COVER WAND RF STERILE (DRAPES) ×3 IMPLANT
CUFF TOURN SGL QUICK 12 (TOURNIQUET CUFF) IMPLANT
CUFF TOURN SGL QUICK 18X4 (TOURNIQUET CUFF) IMPLANT
DRAPE FLUOR MINI C-ARM 54X84 (DRAPES) IMPLANT
DRAPE XRAY CASSETTE 23X24 (DRAPES) IMPLANT
DURAPREP 26ML APPLICATOR (WOUND CARE) ×3 IMPLANT
ELECT REM PT RETURN 9FT ADLT (ELECTROSURGICAL) ×3
ELECTRODE REM PT RTRN 9FT ADLT (ELECTROSURGICAL) ×1 IMPLANT
GAUZE PACKING IODOFORM 1/2 (PACKING) ×3 IMPLANT
GAUZE SPONGE 4X4 12PLY STRL (GAUZE/BANDAGES/DRESSINGS) ×3 IMPLANT
GAUZE XEROFORM 1X8 LF (GAUZE/BANDAGES/DRESSINGS) ×3 IMPLANT
GLOVE SURG ENC MOIS LTX SZ7.5 (GLOVE) ×3 IMPLANT
GLOVE SURG UNDER LTX SZ8 (GLOVE) ×3 IMPLANT
GOWN STRL REUS W/ TWL XL LVL3 (GOWN DISPOSABLE) ×2 IMPLANT
GOWN STRL REUS W/TWL XL LVL3 (GOWN DISPOSABLE) ×4
IV NS IRRIG 3000ML ARTHROMATIC (IV SOLUTION) IMPLANT
KIT TURNOVER KIT A (KITS) ×3 IMPLANT
LABEL OR SOLS (LABEL) ×3 IMPLANT
MANIFOLD NEPTUNE II (INSTRUMENTS) ×3 IMPLANT
NEEDLE FILTER BLUNT 18X 1/2SAF (NEEDLE) ×2
NEEDLE FILTER BLUNT 18X1 1/2 (NEEDLE) ×1 IMPLANT
NEEDLE HYPO 25X1 1.5 SAFETY (NEEDLE) ×3 IMPLANT
NS IRRIG 500ML POUR BTL (IV SOLUTION) ×3 IMPLANT
PACK EXTREMITY ARMC (MISCELLANEOUS) ×3 IMPLANT
PAD ABD DERMACEA PRESS 5X9 (GAUZE/BANDAGES/DRESSINGS) ×6 IMPLANT
PULSAVAC PLUS IRRIG FAN TIP (DISPOSABLE)
SHIELD FULL FACE ANTIFOG 7M (MISCELLANEOUS) ×3 IMPLANT
STOCKINETTE M/LG 89821 (MISCELLANEOUS) ×3 IMPLANT
STRAP SAFETY 5IN WIDE (MISCELLANEOUS) ×3 IMPLANT
SUT ETHILON 3-0 FS-10 30 BLK (SUTURE) ×3
SUT ETHILON 5-0 FS-2 18 BLK (SUTURE) ×3 IMPLANT
SUT VIC AB 4-0 FS2 27 (SUTURE) ×3 IMPLANT
SUTURE EHLN 3-0 FS-10 30 BLK (SUTURE) ×1 IMPLANT
SWAB CULTURE AMIES ANAERIB BLU (MISCELLANEOUS) ×3 IMPLANT
SYR 10ML LL (SYRINGE) ×9 IMPLANT
TIP FAN IRRIG PULSAVAC PLUS (DISPOSABLE) IMPLANT

## 2020-08-30 NOTE — Transfer of Care (Signed)
Immediate Anesthesia Transfer of Care Note  Patient: Sally Lynch  Procedure(s) Performed: AMPUTATION TOE-3rd Toe (Right: Toe)  Patient Location: PACU  Anesthesia Type:MAC  Level of Consciousness: drowsy  Airway & Oxygen Therapy: Patient Spontanous Breathing and Patient connected to face mask oxygen  Post-op Assessment: Report given to RN and Post -op Vital signs reviewed and stable  Post vital signs: Reviewed and stable  Last Vitals:  Vitals Value Taken Time  BP 108/65 08/30/20 1315  Temp 36.8 C 08/30/20 1315  Pulse 101 08/30/20 1321  Resp 26 08/30/20 1321  SpO2 95 % 08/30/20 1321  Vitals shown include unvalidated device data.  Last Pain:  Vitals:   08/30/20 1204  TempSrc: Temporal  PainSc: 0-No pain         Complications: No notable events documented.

## 2020-08-30 NOTE — Progress Notes (Signed)
[facility]  ROUNDING NOTE   Subjective:   Ms. ONETTA WUJEK is a 65 y.o.  female with CHF, CAD, hypertension, MI, diabetes, and ESRD on dialysis, who was admitted to [facility] on 08/29/2020 for scheduled procedure. Patient underwent angiogram on 08/29/20 and is was determined an amputation is needed.  Patient resting in bed before surgery Currently NPO No complaints Dialysis yesterday went well Patient seen later in PACU Sedated  Objective:  Vital signs in last 24 hours:  Temp:  [97.4 F (36.3 C)-99.1 F (37.3 C)] 97.4 F (36.3 C) (06/15 1415) Pulse Rate:  [71-121] 86 (06/15 1415) Resp:  [14-31] 14 (06/15 1415) BP: (91-132)/(53-86) 112/63 (06/15 1415) SpO2:  [91 %-100 %] 96 % (06/15 1415) Weight:  [85 kg] 85 kg (06/15 1204)  Weight change:  Filed Weights   08/29/20 0921 08/30/20 1204  Weight: 85 kg 85 kg    Intake/Output: I/O last 3 completed shifts: In: -  Out: 1500 [Other:1500]   Intake/Output this shift:  Total I/O In: 150 [I.V.:150] Out: 2 [Blood:2]  Physical Exam: General: NAD, laying in bed  Head: Normocephalic, atraumatic. Moist oral mucosal membranes  Eyes: Anicteric  Lungs:  Clear to auscultation  Heart: Regular rate and rhythm, normal effort  Abdomen:  Soft, nontender  Extremities:  trace peripheral edema, Rt foot surgical dressing post op  Neurologic: Alert, moving all four extremities  Skin: Rt foot surgical dressing  Access: Lt AVF    Basic Metabolic Panel: Recent Labs  Lab 08/30/20 0546  NA 138  K 4.6  CL 96*  CO2 29  GLUCOSE 319*  BUN 30*  CREATININE 5.50*  CALCIUM 8.3*    Liver Function Tests: No results for input(s): AST, ALT, ALKPHOS, BILITOT, PROT, ALBUMIN in the last 168 hours. No results for input(s): LIPASE, AMYLASE in the last 168 hours. No results for input(s): AMMONIA in the last 168 hours.  CBC: No results for input(s): WBC, NEUTROABS, HGB, HCT, MCV, PLT in the last 168 hours.  Cardiac Enzymes: No  results for input(s): CKTOTAL, CKMB, CKMBINDEX, TROPONINI in the last 168 hours.  BNP: Invalid input(s): POCBNP  CBG: Recent Labs  Lab 08/29/20 0921 08/29/20 1125 08/30/20 1014 08/30/20 1156 08/30/20 1327  GLUCAP 93 83 256* 215* 253*    Microbiology: Results for orders placed or performed during the hospital encounter of 02/23/20  Aerobic Culture  (superficial specimen)     Status: None   Collection Time: 02/23/20 12:50 PM   Specimen: Toe  Result Value Ref Range Status   Specimen Description   Final    TOE Performed at [facility], [address] [place], [place] [address]    Special Requests   Final    ABCESS Performed at [facility], [address] [place], [place] [address]    Gram Stain   Final    FEW WBC PRESENT, PREDOMINANTLY PMN RARE GRAM POSITIVE COCCI Performed at [facility], [place] [address] [place], [place] [address]    Culture FEW STAPHYLOCOCCUS LUGDUNENSIS  Final   Report Status 02/26/2020 FINAL  Final   Organism ID, Bacteria STAPHYLOCOCCUS LUGDUNENSIS  Final      Susceptibility   Staphylococcus lugdunensis - MIC*    CIPROFLOXACIN <=0.5 SENSITIVE Sensitive     ERYTHROMYCIN <=0.25 SENSITIVE Sensitive     GENTAMICIN <=0.5 SENSITIVE Sensitive     OXACILLIN 2 SENSITIVE Sensitive     TETRACYCLINE <=1 SENSITIVE Sensitive     VANCOMYCIN <=0.5 SENSITIVE  Sensitive     TRIMETH/SULFA <=10 SENSITIVE Sensitive     CLINDAMYCIN <=0.25 SENSITIVE Sensitive     RIFAMPIN <=0.5 SENSITIVE Sensitive     Inducible Clindamycin NEGATIVE Sensitive     * FEW STAPHYLOCOCCUS LUGDUNENSIS    Coagulation Studies: No results for input(s): LABPROT, INR in the last 72 hours.  Urinalysis: No results for input(s): COLORURINE, LABSPEC, PHURINE, GLUCOSEU, HGBUR, BILIRUBINUR, KETONESUR, PROTEINUR, UROBILINOGEN, NITRITE, LEUKOCYTESUR in the last 72 hours.  Invalid input(s): APPERANCEUR    Imaging: PERIPHERAL VASCULAR CATHETERIZATION  Result  Date: 08/29/2020 See surgical note for result.    Medications:    [MAR Hold] sodium chloride      [MAR Hold] atorvastatin  80 mg Oral QHS   atropine       [MAR Hold] calcitRIOL  2.25 mcg Oral Once per day on Mon Wed Fri   chlorhexidine  60 mL Topical Once   [MAR Hold] Chlorhexidine Gluconate Cloth  6 each Topical Q0600   [MAR Hold] cholecalciferol  1,000 Units Oral Daily   [MAR Hold] cinacalcet  30 mg Oral QODAY   [MAR Hold] cinacalcet  60 mg Oral QODAY   [MAR Hold] doxycycline  100 mg Oral BID   [MAR Hold] gabapentin  300-600 mg Oral QHS   [MAR Hold] insulin aspart  0-15 Units Subcutaneous TID WC   [MAR Hold] insulin aspart  4 Units Subcutaneous TID WC   [MAR Hold] insulin glargine  30 Units Subcutaneous Daily   [MAR Hold] midodrine  5 mg Oral BID WC   [MAR Hold] pantoprazole  40 mg Oral Daily   [MAR Hold] sodium chloride flush  3 mL Intravenous Q12H   [MAR Hold] sodium zirconium cyclosilicate  10 g Oral Weekly   [MAR Hold] sucroferric oxyhydroxide  500 mg Oral TID WC   [MAR Hold] sodium chloride, [MAR Hold] acetaminophen, fentaNYL (SUBLIMAZE) injection, [MAR Hold] hydrOXYzine, [MAR Hold]  morphine injection, [MAR Hold] nitroGLYCERIN, [MAR Hold] ondansetron (ZOFRAN) IV, ondansetron (ZOFRAN) IV, [MAR Hold] oxyCODONE, [MAR Hold] sodium chloride flush  Assessment/ Plan:  Ms. LILIANA SCHWINGHAMMER is a 65 y.o.  female CHF, CAD, hypertension, MI, diabetes, and ESRD on dialysis, who was admitted to [facility] on 08/29/2020 for scheduled procedure. Patient underwent angiogram on 08/29/20 and is was determined an amputation is needed.  [facility]/MWF/Lt AVF   End Stage Renal Disease on hemodialysis:  Will maintain outpatient schedule during admission Received dialysis yesterday due to scheduled surgery on dialysis day Tolerated well Will dialyze tomorrow for short treatment, then resume normal schedule Friday  Lab Results  Component Value Date   CREATININE 5.50 (H) 08/30/2020    CREATININE 7.38 (H) 06/14/2019   CREATININE 9.51 (H) 06/14/2019    Intake/Output Summary (Last 24 hours) at 08/30/2020 1519 Last data filed at 08/30/2020 1319 Gross per 24 hour  Intake 150 ml  Output 1502 ml  Net -1352 ml   2. Anemia of chronic kidney disease .  Lab Results  Component Value Date   HGB 10.7 (L) 06/14/2019   Hgb above target Will continue to monitor  3. Secondary Hyperparathyroidism:   Lab Results  Component Value Date   CALCIUM 8.3 (L) 08/30/2020    Cinacalcet and Calcitriol ordered Binders ordered with meals  4. Diabetes mellitus type II with chronic kidney disease insulin dependent. Home regimen includes Lantus. Most recent hemoglobin A1c is 8.7 on 06/14/19.  Stable at this time Managed by primary team  5. Rt third toe gangrene  Amputation of said toe  on 08/30/20 Cardiac concerns during procedure, 3rd degree heart block with severe hypotension and bradycardia Cardiology consulted    LOS: 0 Nelta Caudill 6/15/20223:19 PM

## 2020-08-30 NOTE — Addendum Note (Signed)
Addendum  created 08/30/20 1551 by Gunnar Fusi, MD   Clinical Note Signed

## 2020-08-30 NOTE — Anesthesia Postprocedure Evaluation (Addendum)
Anesthesia Post Note  Patient: Sally Lynch  Procedure(s) Performed: AMPUTATION TOE-3rd Toe (Right: Toe)  Patient location during evaluation: PACU Anesthesia Type: General Level of consciousness: awake and alert Pain management: pain level controlled Vital Signs Assessment: post-procedure vital signs reviewed and stable Respiratory status: spontaneous breathing and respiratory function stable Cardiovascular status: stable (Pt with episode of 3rd degree heart block in OR. Tx with glycopyrulate, ephedrine and atropine converted to ST and remained stable. Seen by Cardiology and sent to telemetry) Anesthetic complications: no   No notable events documented.   Last Vitals:  Vitals:   08/30/20 1515 08/30/20 1530  BP: 103/60 106/61  Pulse: 76 73  Resp: 13 11  Temp: (!) 36.4 C   SpO2: 94% 93%    Last Pain:  Vitals:   08/30/20 1530  TempSrc:   PainSc: 0-No pain                 Alexiya Franqui K

## 2020-08-30 NOTE — Consult Note (Addendum)
WOC consult requested for necrotic toe prior to podiatry involvement.  They are now following for assessment and plan of care and plan to take the patient to the OR today, according to progress notes.  Please refer to Dr Vickki Muff of the podiatry team for further questions regarding plan of care. Please re-consult if further assistance is needed.  Thank-you,  Julien Girt MSN, [place], [place], [place], [place]

## 2020-08-30 NOTE — Anesthesia Preprocedure Evaluation (Signed)
Anesthesia Evaluation  Patient identified by MRN, date of birth, ID band Patient awake    Reviewed: Allergy & Precautions, NPO status , Patient's Chart, lab work & pertinent test results  History of Anesthesia Complications Negative for: history of anesthetic complications  Airway Mallampati: III       Dental   Pulmonary neg sleep apnea, neg COPD, Not current smoker,           Cardiovascular hypertension, Pt. on medications + Past MI, + Cardiac Stents and +CHF  (-) dysrhythmias (-) Valvular Problems/Murmurs     Neuro/Psych neg Seizures    GI/Hepatic Neg liver ROS, GERD  Medicated and Controlled,  Endo/Other  diabetes, Type 2, Oral Hypoglycemic Agents  Renal/GU ESRF and DialysisRenal disease     Musculoskeletal   Abdominal   Peds  Hematology   Anesthesia Other Findings   Reproductive/Obstetrics                             Anesthesia Physical Anesthesia Plan  ASA: 4  Anesthesia Plan: General   Post-op Pain Management:    Induction: Intravenous  PONV Risk Score and Plan: 3 and Propofol infusion and TIVA  Airway Management Planned: Nasal Cannula  Additional Equipment:   Intra-op Plan:   Post-operative Plan:   Informed Consent: I have reviewed the patients History and Physical, chart, labs and discussed the procedure including the risks, benefits and alternatives for the proposed anesthesia with the patient or authorized representative who has indicated his/her understanding and acceptance.       Plan Discussed with:   Anesthesia Plan Comments:         Anesthesia Quick Evaluation

## 2020-08-30 NOTE — Progress Notes (Signed)
Patient transferred to 2a from 2c s/p R toe amputation.  Per report, during procedure patient went into complete heart block.  Cardiology seen patient. At this time, patient without complaints and SR.

## 2020-08-30 NOTE — Progress Notes (Signed)
Inpatient Diabetes Program Recommendations  AACE/ADA: New Consensus Statement on Inpatient Glycemic Control   Target Ranges:  Prepandial:   less than 140 mg/dL      Peak postprandial:   less than 180 mg/dL (1-2 hours)      Critically ill patients:  140 - 180 mg/dL  Results for MCKAILA, RENNHACK (MRN [id number]) as of 08/30/2020 09:48  Ref. Range 08/30/2020 05:46  Glucose Latest Ref Range: 70 - 99 mg/dL 319 (H)   Results for MAGDALENA, HASBERRY (MRN [id number]) as of 08/30/2020 09:48  Ref. Range 08/29/2020 09:21 08/29/2020 11:25  Glucose-Capillary Latest Ref Range: 70 - 99 mg/dL 93   Solumedrol 125 mg '@9'$ :38 83   Review of Glycemic Control  Diabetes history: DM2 Outpatient Diabetes medications: Lantus 30 units daily Current orders for Inpatient glycemic control: Lantus 30 units daily  Inpatient Diabetes Program Recommendations:    Insulin: While inpatient, please order CBGs AC&HS with Novolog 0-9 units TID with meals and Novolog 0-5 units QHS.  Thanks, Barnie Alderman, RN, MSN, CDE Diabetes Coordinator Inpatient Diabetes Program [phone] (Team Pager from 8am to 5pm)

## 2020-08-30 NOTE — Consult Note (Signed)
Cardiology Consultation Note    Patient ID: Sally Lynch, MRN: [id number], DOB/AGE: [birth year] 65 y.o. Admit date: 08/29/2020   Date of Consult: 08/30/2020 Primary Physician: Rogelia Rohrer, MD Primary Cardiologist: Dr. Eber Jones  Chief Complaint: pvd Reason for Consultation: bradycardia during propofol Requesting MD: Cala Bradford  HPI: Sally Lynch is a 65 y.o. female with history of Coronary heart disease including history of   percutaneous revascularization of the first diagonal branch with a Taxus drug-eluting stent on 05/21/2005,   Percutaneous revascularization of a right coronary lesion with a Xience drug-eluting stent on 05/07/2011,        Coronary angiography on 09/07/2013 demonstrated an occluded mid LAD, an occluded third obtuse marginal branch; an occluded posterolateral branch; and patent diagonal branch and RCA stents,  Inferior/posterior akinesis and abnormal septal motion with an estimated ejection fraction of 40-45% by echocardiography on 04/23/2016; left ventricular ejection fraction estimated at 20 to 25% by echocardiography on10/20/2020.  This has been carried out at [facility].  Has chronic kidney disease. Is treated with atorvastatin, midodrine and states she was taken off of plavix perop but has had bleeding with this.  She developed chb and hypotension during propofol induction.Was given atropine with resultant tachycardia. Currently in nsr at 37 and asympotmatic.   Past Medical History:  Diagnosis Date   Anginal pain ([place])    Arthritis    CHF (congestive heart failure) (HCC)    Chronic kidney disease    Coronary artery disease    Diabetes mellitus without complication (HCC)    GERD (gastroesophageal reflux disease)    Hypertension    Myocardial infarction [facility])       Surgical History:  Past Surgical History:  Procedure Laterality Date   A/V FISTULAGRAM Left 01/06/2018   Procedure: A/V FISTULAGRAM;  Surgeon: Katha Cabal, MD;  Location: [facility]  CV LAB;  Service: Cardiovascular;  Laterality: Left;   AV FISTULA PLACEMENT  2011   CARDIAC CATHETERIZATION     EYE SURGERY     bilateral cararact surgery   LOWER EXTREMITY ANGIOGRAPHY Right 08/29/2020   Procedure: LOWER EXTREMITY ANGIOGRAPHY;  Surgeon: Katha Cabal, MD;  Location: [place] CV LAB;  Service: Cardiovascular;  Laterality: Right;   PERIPHERAL VASCULAR CATHETERIZATION N/A 07/12/2015   Procedure: A/V Shuntogram/Fistulagram;  Surgeon: Katha Cabal, MD;  Location: [place] CV LAB;  Service: Cardiovascular;  Laterality: N/A;     Home Meds: Prior to Admission medications   Medication Sig Start Date End Date Taking? Authorizing Provider  acetaminophen (TYLENOL) 325 MG tablet Take 650 mg by mouth every 4 (four) hours as needed for moderate pain.   Yes [provider]  atorvastatin (LIPITOR) 80 MG tablet Take 80 mg by mouth daily.    Yes [provider]  calcitRIOL (ROCALTROL) 0.25 MCG capsule Take 2.25 mcg by mouth 3 (three) times a week.  10/20/17  Yes [provider]  cholecalciferol (VITAMIN D) 1000 units tablet Take 1,000 Units by mouth daily.   Yes [provider]  cinacalcet (SENSIPAR) 30 MG tablet Take 30 mg by mouth daily. Alternating with 60 mg   Yes [provider]  cinacalcet (SENSIPAR) 60 MG tablet Take 60 mg by mouth every other day. alternating with 30 mg   Yes [provider]  doxycycline (VIBRAMYCIN) 100 MG capsule Take 1 capsule (100 mg total) by mouth 2 (two) times daily. 02/23/20  Yes Margarette Canada, NP  gabapentin (NEURONTIN) 300 MG capsule Take 300-600  mg by mouth at bedtime.    Yes [provider]  insulin glargine (LANTUS) 100 UNIT/ML injection Inject 30 Units into the skin daily.    Yes [provider]  LOKELMA 10 g PACK packet Take by mouth once a week. 09/27/19  Yes [provider]  midodrine (PROAMATINE) 5 MG tablet Take 1 tab 1/2 hr before dialysis, take 1 tab mid  treatment prn hypotension 05/22/16  Yes [provider]  mupirocin ointment (BACTROBAN) 2 % Apply topically. 03/20/20  Yes [provider]  mupirocin ointment (BACTROBAN) 2 % Apply to right foot wound daily 04/27/20  Yes Schnier, Dolores Lory, MD  pantoprazole (PROTONIX) 40 MG tablet Take 40 mg by mouth daily.   Yes [provider]  sodium zirconium cyclosilicate (LOKELMA) 10 g PACK packet Take by mouth.   Yes [provider]  triamcinolone cream (KENALOG) 0.1 % Mix 1:1 w Eucerin cream.  Apply to dry itchy skin twice daily prn 10/26/13  Yes [provider]  VELPHORO 500 MG chewable tablet Chew by mouth. 08/30/19  Yes [provider]  aspirin 81 MG tablet Take 81 mg by mouth daily.     [provider]  clopidogrel (PLAVIX) 75 MG tablet Take 75 mg by mouth daily.     [provider]  hydrOXYzine (ATARAX/VISTARIL) 25 MG tablet Take 25 mg by mouth 3 (three) times daily as needed.    [provider]  Insulin Pen Needle (B-D UF III MINI PEN NEEDLES) 31G X 5 MM MISC Inject into the skin. 12/14/19   [provider]  lidocaine-prilocaine (EMLA) cream Apply topically. 07/19/17   [provider]  metoprolol succinate (TOPROL-XL) 25 MG 24 hr tablet Take 25 mg by mouth. Non-dialysis days Patient not taking: Reported on 08/29/2020    [provider]  moxifloxacin (VIGAMOX) 0.5 % ophthalmic solution Apply to eye. Patient not taking: Reported on 08/29/2020 11/17/19   [provider]  nitroGLYCERIN (NITROSTAT) 0.4 MG SL tablet Place 0.4 mg under the tongue every 5 (five) minutes as needed for chest pain.    [provider]  sevelamer carbonate (RENVELA) 800 MG tablet Take 4,000 mg by mouth 3 (three) times daily.  Patient not taking: Reported on 08/29/2020 09/10/13   [provider]  sodium polystyrene (KAYEXALATE) 15 GM/60ML suspension Take 20 g by mouth every other day.  Patient not taking:  Reported on 08/29/2020    [provider]    Inpatient Medications:   [MAR Hold] atorvastatin  80 mg Oral QHS   atropine       [MAR Hold] calcitRIOL  2.25 mcg Oral Once per day on Mon Wed Fri   chlorhexidine  60 mL Topical Once   [MAR Hold] Chlorhexidine Gluconate Cloth  6 each Topical Q0600   [MAR Hold] cholecalciferol  1,000 Units Oral Daily   [MAR Hold] cinacalcet  30 mg Oral QODAY   [MAR Hold] cinacalcet  60 mg Oral QODAY   [MAR Hold] doxycycline  100 mg Oral BID   [MAR Hold] gabapentin  300-600 mg Oral QHS   [MAR Hold] insulin aspart  0-15 Units Subcutaneous TID WC   [MAR Hold] insulin aspart  4 Units Subcutaneous TID WC   [MAR Hold] insulin glargine  30 Units Subcutaneous Daily   [MAR Hold] midodrine  5 mg Oral BID WC   [MAR Hold] pantoprazole  40 mg Oral Daily   [MAR Hold] sodium chloride flush  3 mL Intravenous Q12H   [facility]  Hold] sodium zirconium cyclosilicate  10 g Oral Weekly   [MAR Hold] sucroferric oxyhydroxide  500 mg Oral TID WC    [MAR Hold] sodium chloride      Allergies:  Allergies  Allergen Reactions   Shellfish Allergy Hives   Contrast Media [Iodinated Diagnostic Agents] Hives   Furosemide Rash   Other Rash    Raw fruits   Peanut Oil Rash    Social History   Socioeconomic History   Marital status: Widowed    Spouse name: Not on file   Number of children: Not on file   Years of education: Not on file   Highest education level: Not on file  Occupational History   Not on file  Tobacco Use   Smoking status: Never   Smokeless tobacco: Never  Substance and Sexual Activity   Alcohol use: No   Drug use: No   Sexual activity: Never    Birth control/protection: Post-menopausal  Other Topics Concern   Not on file  Social History Narrative   Not on file   Social Determinants of Health   Financial Resource Strain: Not on file  Food Insecurity: Not on file  Transportation Needs: Not on file  Physical Activity: Not on file  Stress: Not on  file  Social Connections: Not on file  Intimate Partner Violence: Not on file     Family History  Problem Relation Age of Onset   Hypertension Mother    Diabetes Mother    Diabetes Father    Heart attack Father      Review of Systems: A 12-system review of systems was performed and is negative except as noted in the HPI.  Labs: No results for input(s): CKTOTAL, CKMB, TROPONINI in the last 72 hours. Lab Results  Component Value Date   WBC 10.1 06/14/2019   HGB 10.7 (L) 06/14/2019   HCT 34.6 (L) 06/14/2019   MCV 103.9 (H) 06/14/2019   PLT 132 (L) 06/14/2019    Recent Labs  Lab 08/30/20 0546  NA 138  K 4.6  CL 96*  CO2 29  BUN 30*  CREATININE 5.50*  CALCIUM 8.3*  GLUCOSE 319*   No results found for: CHOL, HDL, LDLCALC, TRIG No results found for: DDIMER  Radiology/Studies:  PERIPHERAL VASCULAR CATHETERIZATION  Result Date: 08/29/2020 See surgical note for result.   Wt Readings from Last 3 Encounters:  08/30/20 85 kg  08/10/20 83.4 kg  05/25/20 86.2 kg    EKG: Currently sinus rhythm  Physical Exam:  Blood pressure (!) 109/59, pulse 87, temperature (!) 97.5 F (36.4 C), resp. rate 14, height '5\' 4"'$  (1.626 m), weight 85 kg, SpO2 94 %. Body mass index is 32.17 kg/m. General: Well developed, well nourished, in no acute distress. Head: Normocephalic, atraumatic, sclera non-icteric, no xanthomas, nares are without discharge.  Neck: Negative for carotid bruits. JVD not elevated. Lungs: Clear bilaterally to auscultation without wheezes, rales, or rhonchi. Breathing is unlabored. Heart: RRR with S1 S2. No murmurs, rubs, or gallops appreciated. Abdomen: Soft, non-tender, non-distended with normoactive bowel sounds. No hepatomegaly. No rebound/guarding. No obvious abdominal masses. Msk:  Strength and tone appear normal for age. Extremities: No clubbing or cyanosis. No edema.  Distal pedal pulses are 2+ and equal bilaterally. Neuro: Alert and oriented X 3. No facial  asymmetry. No focal deficit. Moves all extremities spontaneously. Psych:  Responds to questions appropriately with a normal affect.     Assessment and Plan  65 year old female with history of coronary artery disease  status post multiple stents done in 2017 through 2015 at [facility], history of probable ischemic cardiomyopathy by echo in 2020 with an EF of 25% who developed transient high-grade heart block during propofol induction for peripheral vascular surgery by podiatry.  Patient was given atropine.  Has had improvement in her heart rate and currently has heart rate in the mid 80s and is asymptomatic.  This may be related to reduction.  We will continue to follow.  No further work-up indicated at present pending course.  Signed, Teodoro Spray MD 08/30/2020, 2:10 PM Pager: [phone]

## 2020-08-30 NOTE — Op Note (Signed)
Operative note   Surgeon:Tashari Schoenfelder Lawyer: None    Preop diagnosis: Gangrene right third toe    Postop diagnosis: Same    Procedure: Amputation right third toe MTPJ    EBL: Minimal    Anesthesia:local and IV sedation    Hemostasis: None    Specimen: Gangrene right third toe for pathology and deep wound culture    Complications: No no surgical complication.  Patient did have complete heart block intraoperatively and was stabilized by anesthesia.    Operative indications:Sally Lynch is an 65 y.o. that presents today for surgical intervention.  The risks/benefits/alternatives/complications have been discussed and consent has been given.    Procedure:  Patient was brought into the OR and placed on the operating table in thesupine position. After anesthesia was obtained theright lower extremity was prepped and draped in usual sterile fashion.  Attention was directed to the right third toe where the gangrenous changes were noted distal to the metatarsophalangeal joint.  2 full-thickness flaps were created and the toe was disarticulated at the metatarsophalangeal joint and removed from the surgical field in toto.  The wound was flushed with copious amounts of irrigation.  Closure was performed with simple interrupted 3-0 nylon sutures.  Bulky sterile dressing was applied.    Patient tolerated the procedure and anesthesia well.  Was transported from the OR to the PACU with all vital signs stable and vascular status intact. To be discharged per routine protocol.  Will follow up in approximately 1 week in the outpatient clinic.

## 2020-08-31 ENCOUNTER — Encounter: Payer: Self-pay | Admitting: Podiatry

## 2020-08-31 LAB — GLUCOSE, CAPILLARY
Glucose-Capillary: 233 mg/dL — ABNORMAL HIGH (ref 70–99)
Glucose-Capillary: 87 mg/dL (ref 70–99)
Glucose-Capillary: 75 mg/dL (ref 70–99)

## 2020-08-31 MED ORDER — GUAIFENESIN-DM 100-10 MG/5ML PO SYRP
5.0000 mL | ORAL_SOLUTION | ORAL | Status: DC | PRN
  Administered 2020-08-31: 5 mL via ORAL
  Filled 2020-08-31: qty 5

## 2020-08-31 NOTE — Progress Notes (Signed)
HS blood sugar 75. Patient alert and oriented. Asymptomatic. Meal tray given to patient . Will re- assess.

## 2020-08-31 NOTE — Progress Notes (Signed)
Hemodialysis patient known at [facility] MWF 5:15am. Patient normally transports self. With recent surgery patient is planning on grandson to transport, however he can not start until Monday. Patient stated concerns of discharging today without transportation in the morning to treatment. Discussed this with Nephrology, they stated patient can stay and get a treatment in the morning and discharge after. Patient was also concerned about Home Health and wound care. Discussed this with Valla Leaver stated she would look into Home Health. Please contact me with any dialysis placement concerns.  Elvera Bicker Dialysis Coordinator [phone]

## 2020-08-31 NOTE — Progress Notes (Signed)
[facility]  Daily Progress Note   Subjective  - 1 Day Post-Op  Patient denies pain in her right foot  Objective Vitals:   08/31/20 1315 08/31/20 1330 08/31/20 1345 08/31/20 1522  BP: 110/63 117/65 105/61 (!) 112/50  Pulse:    75  Resp: '15 16 17 17  '$ Temp:    97.8 F (36.6 C)  TempSrc:      SpO2:    94%  Weight:      Height:        Intake/Output Summary (Last 24 hours) at 08/31/2020 1553 Last data filed at 08/31/2020 1428 Gross per 24 hour  Intake 503 ml  Output 500 ml  Net 3 ml    PULM  Normal effort , no use of accessory muscles CV  No JVD, RRR Abd      No distended, nontender VASC  right foot is dressed.  Laboratory CBC    Component Value Date/Time   WBC 10.1 06/14/2019 0738   HGB 10.7 (L) 06/14/2019 0738   HCT 34.6 (L) 06/14/2019 0738   PLT 132 (L) 06/14/2019 0738    BMET    Component Value Date/Time   NA 138 08/30/2020 0546   K 4.6 08/30/2020 0546   CL 96 (L) 08/30/2020 0546   CO2 29 08/30/2020 0546   GLUCOSE 319 (H) 08/30/2020 0546   BUN 30 (H) 08/30/2020 0546   CREATININE 5.50 (H) 08/30/2020 0546   CALCIUM 8.3 (L) 08/30/2020 0546   GFRNONAA 8 (L) 08/30/2020 0546   GFRAA 6 (L) 06/14/2019 1140    Assessment/Planning: POD #2/1 s/p right lower extremity angiography with intervention for successful revascularization/partial amputation third toe right foot  Patient doing well we will plan for discharge when okay with nephrology and podiatry   Hortencia Pilar  08/31/2020, 3:53 PM

## 2020-08-31 NOTE — Progress Notes (Signed)
Daily Progress Note   Subjective  - 1 Day Post-Op  S/p toe amputation for gangrene.  No complaints  Objective Vitals:   08/31/20 0732 08/31/20 1130 08/31/20 1145 08/31/20 1200  BP: 104/73 96/62 104/77 105/66  Pulse: 73     Resp: '18 18 15 19  '$ Temp: 98.2 F (36.8 C)  98.8 F (37.1 C)   TempSrc:      SpO2: 94%     Weight:      Height:        Physical Exam: Incision well coapted.  No purulence.  No foul odor.  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 OK for home health to change weekly. Can receive abx (cephalosporin) with dialysis for next 2 weeks.   F/u with me in 2 weeks. OK from podiatry standpoint to d/c.   Samara Deist A  08/31/2020, 12:42 PM S/p

## 2020-08-31 NOTE — Progress Notes (Signed)
[facility]  ROUNDING NOTE   Subjective:   [name]  female with CHF, CAD, hypertension, MI, diabetes, and ESRD on dialysis, who was admitted to [facility] on 08/29/2020 for scheduled procedure. Patient underwent angiogram on 08/29/20 and is was determined an amputation is needed.  Patient seen during dialysis   HEMODIALYSIS FLOWSHEET:  Blood Flow Rate (mL/min): 400 mL/min Arterial Pressure (mmHg): 170 mmHg Venous Pressure (mmHg): 200 mmHg Transmembrane Pressure (mmHg): 60 mmHg Ultrafiltration Rate (mL/min): 410 mL/min Dialysate Flow Rate (mL/min): 500 ml/min Conductivity: Machine : 14 Conductivity: Machine : 14  Alert and oriented Denies pain and discomfort No other complaints  Objective:  Vital signs in last 24 hours:  Temp:  [97.2 F (36.2 C)-98.8 F (37.1 C)] 98.8 F (37.1 C) (06/16 1145) Pulse Rate:  [73-82] 73 (06/16 0732) Resp:  [11-22] 17 (06/16 1345) BP: (77-122)/(51-77) 105/61 (06/16 1345) SpO2:  [92 %-97 %] 94 % (06/16 0732) Weight:  [89.6 kg-90.7 kg] 90.7 kg (06/16 0115)  Weight change: 0 kg Filed Weights   08/30/20 1204 08/30/20 1745 08/31/20 0115  Weight: 85 kg 89.6 kg 90.7 kg    Intake/Output: I/O last 3 completed shifts: In: 413 [P.O.:200; I.V.:213] Out: 2 [Blood:2]   Intake/Output this shift:  Total I/O In: 240 [P.O.:240] Out: 0   Physical Exam: General: NAD, laying in bed  Head: Normocephalic, atraumatic. Moist oral mucosal membranes  Eyes: Anicteric  Lungs:  Clear to auscultation, normal effort  Heart: Regular rate and rhythm, normal effort  Abdomen:  Soft, nontender  Extremities:  trace peripheral edema, Rt foot surgical dressing post op  Neurologic: Alert, moving all four extremities  Skin: Rt foot surgical dressing  Access: Lt AVF    Basic Metabolic Panel: Recent Labs  Lab 08/30/20 0546  NA 138  K 4.6  CL 96*  CO2 29  GLUCOSE 319*  BUN 30*  CREATININE 5.50*  CALCIUM 8.3*     Liver  Function Tests: No results for input(s): AST, ALT, ALKPHOS, BILITOT, PROT, ALBUMIN in the last 168 hours. No results for input(s): LIPASE, AMYLASE in the last 168 hours. No results for input(s): AMMONIA in the last 168 hours.  CBC: No results for input(s): WBC, NEUTROABS, HGB, HCT, MCV, PLT in the last 168 hours.  Cardiac Enzymes: No results for input(s): CKTOTAL, CKMB, CKMBINDEX, TROPONINI in the last 168 hours.  BNP: Invalid input(s): POCBNP  CBG: Recent Labs  Lab 08/30/20 1327 08/30/20 1708 08/30/20 1741 08/30/20 2103 08/31/20 0727  GLUCAP 253* 181* 183* 94 233*     Microbiology: Results for orders placed or performed during the hospital encounter of 08/29/20  Aerobic/Anaerobic Culture w Gram Stain (surgical/deep wound)     Status: None (Preliminary result)   Collection Time: 08/30/20 12:47 PM   Specimen: PATH Amputaion Arm/Leg; Wound  Result Value Ref Range Status   Specimen Description   Final    TOE RIGHT 3RD TOE WOUND Performed at [facility], [address] [place], [place] [address]    Special Requests   Final    NONE Performed at [facility], [address] [place], [place] [address]    Gram Stain   Final    RARE WBC PRESENT, PREDOMINANTLY MONONUCLEAR RARE GRAM POSITIVE RODS    Culture   Final    CULTURE REINCUBATED FOR BETTER GROWTH Performed at [facility], [place] [address] [place], [place] [address]    Report Status PENDING  Incomplete  Coagulation Studies: No results for input(s): LABPROT, INR in the last 72 hours.  Urinalysis: No results for input(s): COLORURINE, LABSPEC, PHURINE, GLUCOSEU, HGBUR, BILIRUBINUR, KETONESUR, PROTEINUR, UROBILINOGEN, NITRITE, LEUKOCYTESUR in the last 72 hours.  Invalid input(s): APPERANCEUR    Imaging: No results found.   Medications:    sodium chloride     sodium chloride     sodium chloride      atorvastatin  80 mg Oral QHS   calcitRIOL  2.25 mcg Oral Once per day on  Mon Wed Fri   chlorhexidine  60 mL Topical Once   Chlorhexidine Gluconate Cloth  6 each Topical Q0600   cholecalciferol  1,000 Units Oral Daily   cinacalcet  30 mg Oral QODAY   cinacalcet  60 mg Oral QODAY   doxycycline  100 mg Oral BID   gabapentin  300-600 mg Oral QHS   insulin aspart  0-15 Units Subcutaneous TID WC   insulin aspart  4 Units Subcutaneous TID WC   insulin glargine  30 Units Subcutaneous Daily   midodrine  5 mg Oral BID WC   pantoprazole  40 mg Oral Daily   sodium chloride flush  3 mL Intravenous Q12H   [START ON 09/04/2020] sodium zirconium cyclosilicate  10 g Oral Weekly   sucroferric oxyhydroxide  500 mg Oral TID WC   sodium chloride, sodium chloride, sodium chloride, acetaminophen, alteplase, guaiFENesin-dextromethorphan, heparin, hydrOXYzine, lidocaine (PF), lidocaine-prilocaine, morphine injection, nitroGLYCERIN, ondansetron (ZOFRAN) IV, oxyCODONE, pentafluoroprop-tetrafluoroeth, sodium chloride flush  Assessment/ Plan:  [name]  female CHF, CAD, hypertension, MI, diabetes, and ESRD on dialysis, who was admitted to [facility] on 08/29/2020 for scheduled procedure. Patient underwent angiogram on 08/29/20 and is was determined an amputation is needed.  [facility]/MWF/Lt AVF   End Stage Renal Disease on hemodialysis:  Will maintain outpatient schedule during admission Missed scheduled dialysis day due to surgery Scheduled for dialysis today Will dialyze tomorrow also to return patient to outpatient schedule Planned discharge after treatment  Lab Results  Component Value Date   CREATININE 5.50 (H) 08/30/2020   CREATININE 7.38 (H) 06/14/2019   CREATININE 9.51 (H) 06/14/2019    Intake/Output Summary (Last 24 hours) at 08/31/2020 1516 Last data filed at 08/31/2020 0950 Gross per 24 hour  Intake 503 ml  Output 0 ml  Net 503 ml    2. Anemia of chronic kidney disease .  Lab Results  Component Value Date   HGB 10.7 (L) 06/14/2019    Hgb at target of 10  Will continue to monitor  3. Secondary Hyperparathyroidism:   Lab Results  Component Value Date   CALCIUM 8.3 (L) 08/30/2020    Cinacalcet and Calcitriol ordered  4. Diabetes mellitus type II with chronic kidney disease insulin dependent. Home regimen includes Lantus. Most recent hemoglobin A1c is 8.7 on 06/14/19.  Stable  SSi by primary team  5. 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

## 2020-09-01 LAB — GLUCOSE, CAPILLARY: Glucose-Capillary: 113 mg/dL — ABNORMAL HIGH (ref 70–99)

## 2020-09-01 LAB — SURGICAL PATHOLOGY

## 2020-09-01 NOTE — Progress Notes (Signed)
Pt HD tx complete. Pt had a 2.5 hour tx from 0921 to 1151. Goal of 9.9A met w/ no complications. Pt is alert, oriented, stable, no c/o. AVF is positive for thrill and bruit post HD and has been de accessed successfully. Report to primary RN and CCMD notified.

## 2020-09-01 NOTE — Progress Notes (Signed)
[facility]  ROUNDING NOTE   Subjective:   [name]  female with CHF, CAD, hypertension, MI, diabetes, and ESRD on dialysis, who was admitted to [facility] on 08/29/2020 for scheduled procedure. Patient underwent angiogram on 08/29/20 and is was determined an amputation is needed.  Patient seen and evaluated during hemodialysis treatment. Tolerating well.  Objective:  Vital signs in last 24 hours:  Temp:  [97.8 F (36.6 C)-99.8 F (37.7 C)] 98.5 F (36.9 C) (06/17 1151) Pulse Rate:  [68-79] 71 (06/17 1154) Resp:  [14-20] 15 (06/17 1154) BP: (103-134)/(50-95) 129/76 (06/17 1154) SpO2:  [91 %-100 %] 95 % (06/17 1154) Weight:  [85.1 kg] 85.1 kg (06/17 0342)  Weight change: 0.14 kg Filed Weights   08/30/20 1745 08/31/20 0115 09/01/20 0342  Weight: 89.6 kg 90.7 kg 85.1 kg    Intake/Output: I/O last 3 completed shifts: In: 66 [P.O.:480; I.V.:3] Out: 500 [Other:500]   Intake/Output this shift:  Total I/O In: -  Out: 500 [Other:500]  Physical Exam: General: NAD, laying in bed  Head: Normocephalic, atraumatic. Moist oral mucosal membranes  Eyes: Anicteric  Lungs:  Clear to auscultation, normal effort  Heart: Regular rate and rhythm, normal effort  Abdomen:  Soft, nontender  Extremities: trace peripheral edema, Rt foot surgical dressing post op  Neurologic: Alert, moving all four extremities  Skin: Rt foot surgical dressing  Access: Lt AVF    Basic Metabolic Panel: Recent Labs  Lab 08/30/20 0546  NA 138  K 4.6  CL 96*  CO2 29  GLUCOSE 319*  BUN 30*  CREATININE 5.50*  CALCIUM 8.3*     Liver Function Tests: No results for input(s): AST, ALT, ALKPHOS, BILITOT, PROT, ALBUMIN in the last 168 hours. No results for input(s): LIPASE, AMYLASE in the last 168 hours. No results for input(s): AMMONIA in the last 168 hours.  CBC: No results for input(s): WBC, NEUTROABS, HGB, HCT, MCV, PLT in the last 168 hours.  Cardiac Enzymes: No  results for input(s): CKTOTAL, CKMB, CKMBINDEX, TROPONINI in the last 168 hours.  BNP: Invalid input(s): POCBNP  CBG: Recent Labs  Lab 08/30/20 2103 08/31/20 0727 08/31/20 1634 08/31/20 2026 09/01/20 0744  GLUCAP 94 233* 87 75 113*     Microbiology: Results for orders placed or performed during the hospital encounter of 08/29/20  Aerobic/Anaerobic Culture w Gram Stain (surgical/deep wound)     Status: None (Preliminary result)   Collection Time: 08/30/20 12:47 PM   Specimen: PATH Amputaion Arm/Leg; Wound  Result Value Ref Range Status   Specimen Description   Final    TOE RIGHT 3RD TOE WOUND Performed at [facility], [address] [place], [place] [address]    Special Requests   Final    NONE Performed at [facility], [address] [place], [place] [address]    Gram Stain   Final    RARE WBC PRESENT, PREDOMINANTLY MONONUCLEAR RARE GRAM POSITIVE RODS    Culture   Final    RARE GRAM NEGATIVE RODS CULTURE REINCUBATED FOR BETTER GROWTH HOLDING FOR POSSIBLE ANAEROBE Performed at [facility], [place] [address], [place],  [address]    Report Status PENDING  Incomplete    Coagulation Studies: No results for input(s): LABPROT, INR in the last 72 hours.  Urinalysis: No results for input(s): COLORURINE, LABSPEC, PHURINE, GLUCOSEU, HGBUR, BILIRUBINUR, KETONESUR, PROTEINUR, UROBILINOGEN, NITRITE, LEUKOCYTESUR in the last 72 hours.  Invalid input(s): APPERANCEUR    Imaging:  No results found.   Medications:    sodium chloride      atorvastatin  80 mg Oral QHS   calcitRIOL  2.25 mcg Oral Once per day on Mon Wed Fri   chlorhexidine  60 mL Topical Once   Chlorhexidine Gluconate Cloth  6 each Topical Q0600   cholecalciferol  1,000 Units Oral Daily   cinacalcet  30 mg Oral QODAY   cinacalcet  60 mg Oral QODAY   doxycycline  100 mg Oral BID   gabapentin  300-600 mg Oral QHS   insulin aspart  0-15 Units Subcutaneous TID WC    insulin aspart  4 Units Subcutaneous TID WC   insulin glargine  30 Units Subcutaneous Daily   midodrine  5 mg Oral BID WC   pantoprazole  40 mg Oral Daily   sodium chloride flush  3 mL Intravenous Q12H   [START ON 09/04/2020] sodium zirconium cyclosilicate  10 g Oral Weekly   sucroferric oxyhydroxide  500 mg Oral TID WC   sodium chloride, acetaminophen, guaiFENesin-dextromethorphan, hydrOXYzine, morphine injection, nitroGLYCERIN, ondansetron (ZOFRAN) IV, oxyCODONE, sodium chloride flush  Assessment/ Plan:  [name]  female CHF, CAD, hypertension, MI, diabetes, and ESRD on dialysis, who was admitted to [facility] on 08/29/2020 for scheduled procedure. Patient underwent angiogram on 08/29/20 and is was determined an amputation is needed.  [facility]/MWF/Lt AVF   End Stage Renal Disease on hemodialysis:  Patient seen and evaluated during hemodialysis treatment.  Tolerating well.  Patient to be discharged today.  Lab Results  Component Value Date   CREATININE 5.50 (H) 08/30/2020   CREATININE 7.38 (H) 06/14/2019   CREATININE 9.51 (H) 06/14/2019    Intake/Output Summary (Last 24 hours) at 09/01/2020 1325 Last data filed at 09/01/2020 1151 Gross per 24 hour  Intake 240 ml  Output 1000 ml  Net -760 ml    2. Anemia of chronic kidney disease .  Lab Results  Component Value Date   HGB 10.7 (L) 06/14/2019   Hgb at target of 10 0.7 Will continue to monitor as an outpatient.  3. Secondary Hyperparathyroidism:   Lab Results  Component Value Date   CALCIUM 8.3 (L) 08/30/2020    Continue both Cinacalcet and calcitriol.  4. Diabetes mellitus type II with chronic kidney disease insulin dependent. Home regimen includes Lantus. Most recent hemoglobin A1c is 8.7 on 06/14/19.  Stable  SSi by primary team  5. Rt third toe gangrene  Amputation of said toe on 08/30/20 Cardiac concerns during procedure, 3rd degree heart block with severe hypotension and  bradycardia History of probable ischemic cardiomyopathy on ECHO in 2020 Will follow with cardiology at discharge    LOS: 2 Jazz Rogala 6/17/20221:25 PM

## 2020-09-01 NOTE — Progress Notes (Signed)
Pre HD RN assessment 

## 2020-09-01 NOTE — Care Management Important Message (Signed)
Important Message  Patient Details  Name: SHUNTERIA BISH MRN: [id number] Date of Birth: 07-22-1955   Medicare Important Message Given:  N/A - LOS <3 / Initial given by admissions   Initial Medicare IM reviewed with patient by Thornton Dales, Patient Access Associate on 08/31/2020 at 10:16am.  Dannette Barbara 09/01/2020, 8:31 AM

## 2020-09-01 NOTE — Progress Notes (Signed)
Pre HD info 

## 2020-09-01 NOTE — Discharge Summary (Signed)
[facility]    Discharge Summary    Patient ID:  Sally Lynch MRN: [id number] DOB/AGE: Oct 03, 1955 65 y.o.  Admit date: 08/29/2020 Discharge date: 09/01/2020 Date of Surgery: 08/30/2020 Surgeon: Juliann Mule): Samara Deist, DPM  Admission Diagnosis: Atherosclerosis of native arteries of the extremities with gangrene ([place]) [I70.269] Heart block [I45.9]  Discharge Diagnoses:  Atherosclerosis of native arteries of the extremities with gangrene ([place]) [I70.269] Heart block [I45.9]  Secondary Diagnoses: Past Medical History:  Diagnosis Date   Anginal pain ([place])    Arthritis    CHF (congestive heart failure) ([place])    Chronic kidney disease    Coronary artery disease    Diabetes mellitus without complication ([place])    GERD (gastroesophageal reflux disease)    Hypertension    Myocardial infarction ([place])     Procedure(s): AMPUTATION TOE-3rd Toe  Discharged Condition: good  HPI:  Patient underwent successful revascularization of the right leg and subsequently right third toe amputation.  She has successfully dialyzed  and is fit for [facility] Hospital Course:  Sally Lynch is a 65 y.o. female is S/P Right Procedure(s): AMPUTATION TOE-3rd Toe Extubated: POD # 0 Physical exam: right foot wrapped Post-op wounds clean, dry, intact Pt. Ambulating, voiding and taking PO diet without difficulty. Pt pain controlled with PO pain meds. Labs as below Complications:none  Consults:  Treatment Team:  Samara Deist, DPM Anthonette Legato, MD  Significant Diagnostic Studies: CBC Lab Results  Component Value Date   WBC 10.1 06/14/2019   HGB 10.7 (L) 06/14/2019   HCT 34.6 (L) 06/14/2019   MCV 103.9 (H) 06/14/2019   PLT 132 (L) 06/14/2019    BMET    Component Value Date/Time   NA 138 08/30/2020 0546   K 4.6 08/30/2020 0546   CL 96 (L) 08/30/2020 0546   CO2 29 08/30/2020 0546   GLUCOSE 319 (H) 08/30/2020 0546   BUN 30 (H) 08/30/2020 0546    CREATININE 5.50 (H) 08/30/2020 0546   CALCIUM 8.3 (L) 08/30/2020 0546   GFRNONAA 8 (L) 08/30/2020 0546   GFRAA 6 (L) 06/14/2019 1140   COAG No results found for: INR, PROTIME   Disposition:  Discharge to :Home Discharge Instructions     Call MD for:  redness, tenderness, or signs of infection (pain, swelling, bleeding, redness, odor or green/yellow discharge around incision site)   Complete by: As directed    Call MD for:  redness, tenderness, or signs of infection (pain, swelling, bleeding, redness, odor or green/yellow discharge around incision site)   Complete by: As directed    Call MD for:  severe or increased pain, loss or decreased feeling  in affected limb(s)   Complete by: As directed    Call MD for:  severe or increased pain, loss or decreased feeling  in affected limb(s)   Complete by: As directed    Call MD for:  temperature >100.5   Complete by: As directed    Call MD for:  temperature >100.5   Complete by: As directed    Discharge instructions   Complete by: As directed    Okay to shower after 36 hours.  Please remove the bandage from the left groin before showering and replace with a Band-Aid daily as needed   Driving Restrictions   Complete by: As directed    No driving for 36 hours   Lifting restrictions   Complete by: As directed    No lifting for 1 week   No dressing  needed   Complete by: As directed    Replace only if drainage present   No dressing needed   Complete by: As directed    Replace only if drainage present   Resume previous diet   Complete by: As directed    Resume previous diet   Complete by: As directed       Allergies as of 09/01/2020       Reactions   Shellfish Allergy Hives   Contrast Media [iodinated Diagnostic Agents] Hives   Furosemide Rash   Other Rash   Raw fruits   Peanut Oil Rash        Medication List     TAKE these medications    acetaminophen 325 MG tablet Commonly known as: TYLENOL Take 650 mg by mouth  every 4 (four) hours as needed for moderate pain.   aspirin 81 MG tablet Take 81 mg by mouth daily.   atorvastatin 80 MG tablet Commonly known as: LIPITOR Take 80 mg by mouth daily.   B-D UF III MINI PEN NEEDLES 31G X 5 MM Misc Generic drug: Insulin Pen Needle Inject into the skin.   calcitRIOL 0.25 MCG capsule Commonly known as: ROCALTROL Take 2.25 mcg by mouth 3 (three) times a week.   cholecalciferol 1000 units tablet Commonly known as: VITAMIN D Take 1,000 Units by mouth daily.   cinacalcet 60 MG tablet Commonly known as: SENSIPAR Take 60 mg by mouth every other day. alternating with 30 mg   cinacalcet 30 MG tablet Commonly known as: SENSIPAR Take 30 mg by mouth daily. Alternating with 60 mg   clopidogrel 75 MG tablet Commonly known as: PLAVIX Take 75 mg by mouth daily.   doxycycline 100 MG capsule Commonly known as: VIBRAMYCIN Take 1 capsule (100 mg total) by mouth 2 (two) times daily.   gabapentin 300 MG capsule Commonly known as: NEURONTIN Take 300-600 mg by mouth at bedtime.   hydrOXYzine 25 MG tablet Commonly known as: ATARAX/VISTARIL Take 25 mg by mouth 3 (three) times daily as needed.   insulin glargine 100 UNIT/ML injection Commonly known as: LANTUS Inject 30 Units into the skin daily.   lidocaine-prilocaine cream Commonly known as: EMLA Apply topically.   sodium zirconium cyclosilicate 10 g Pack packet Commonly known as: LOKELMA Take by mouth.   Lokelma 10 g Pack packet Generic drug: sodium zirconium cyclosilicate Take by mouth once a week.   midodrine 5 MG tablet Commonly known as: PROAMATINE Take 1 tab 1/2 hr before dialysis, take 1 tab mid treatment prn hypotension   mupirocin ointment 2 % Commonly known as: BACTROBAN Apply topically.   mupirocin ointment 2 % Commonly known as: BACTROBAN Apply to right foot wound daily   nitroGLYCERIN 0.4 MG SL tablet Commonly known as: NITROSTAT Place 0.4 mg under the tongue every 5 (five)  minutes as needed for chest pain.   pantoprazole 40 MG tablet Commonly known as: PROTONIX Take 40 mg by mouth daily.   triamcinolone cream 0.1 % Commonly known as: KENALOG Mix 1:1 w Eucerin cream.  Apply to dry itchy skin twice daily prn   Velphoro 500 MG chewable tablet Generic drug: sucroferric oxyhydroxide Chew by mouth.       ASK your doctor about these medications    metoprolol succinate 25 MG 24 hr tablet Commonly known as: TOPROL-XL Take 25 mg by mouth. Non-dialysis days   moxifloxacin 0.5 % ophthalmic solution Commonly known as: VIGAMOX Apply to eye.   sevelamer carbonate 800 MG tablet  Commonly known as: RENVELA Take 4,000 mg by mouth 3 (three) times daily.   sodium polystyrene 15 GM/60ML suspension Commonly known as: KAYEXALATE Take 20 g by mouth every other day.       Verbal and written Discharge instructions given to the patient. Wound care per Discharge AVS  Follow-up Information     Olga Seyler, Dolores Lory, MD Follow up in 2 week(s).   Specialties: Vascular Surgery, Cardiology, Radiology, Vascular Surgery Why: with an ABI Thursday July 7 at 3:00pm Contact information: [place] [address] [phone]         Samara Deist, DPM Follow up in 2 week(s).   Specialty: Podiatry Contact information: [address] [place] [address] [phone]                 Signed: Hortencia Pilar, MD  09/01/2020, 9:36 AM

## 2020-09-01 NOTE — Progress Notes (Signed)
HD end 

## 2020-09-01 NOTE — Progress Notes (Signed)
Post HD vitals 

## 2020-09-01 NOTE — TOC Transition Note (Addendum)
Transition of Care [facility]) - CM/SW Discharge Note   Patient Details  Name: Sally Lynch MRN: [id number] Date of Birth: 01-30-1956  Transition of Care [facility]) CM/SW Contact:  Kerin Salen, RN Phone Number: 09/01/2020, 1:19 PM   Clinical Narrative:  Patient for discharge to home today with [facility] services. [facility] called, [phone] services confirmed, Intake transferred me to [facility] intake, no answer left voices message to continue with discharge date and to continue with Home Health needs to include wound care. My cell phone number given for contact if needed. TOC needs resolved.     Final next level of care: [place] Barriers to Discharge: Barriers Resolved   Patient Goals and CMS Choice Patient states their goals for this hospitalization and ongoing recovery are:: To return home.   Choice offered to / list presented to : NA  Discharge Placement                    Patient and family notified of of transfer: 09/01/20  Discharge Plan and Services                DME Arranged: N/A DME Agency: NA       HH Arranged: RN [place] Agency: [place] Date [place]: 09/01/20 Time HH Agency Contacted: 0930 Representative spoke with at [place]: Transferred to Intake person, left message to resume [place] and call back if questions.  Social Determinants of Health (SDOH) Interventions     Readmission Risk Interventions No flowsheet data found.

## 2020-09-01 NOTE — Progress Notes (Signed)
Discharge instructions explained to pt/  verbalized an understanding/ iv and tele removed/ home health will continue to follow pt / transported off unit via wheelchair.           Note Details  Author Kewon Statler, Clinton Sawyer, RN File Time 09/01/2020  1:24 PM  Author Type Registered Nurse Status Signed  Last Editor Epifania Littrell, Clinton Sawyer, RN Service (none)  [place] # [id number] Admit Date 08/28/2020

## 2020-09-04 LAB — AEROBIC/ANAEROBIC CULTURE W GRAM STAIN (SURGICAL/DEEP WOUND)

## 2020-09-06 ENCOUNTER — Telehealth (INDEPENDENT_AMBULATORY_CARE_PROVIDER_SITE_OTHER): Payer: Self-pay

## 2020-09-06 NOTE — Telephone Encounter (Signed)
Patient left a voicemail stating that she just was seen at Dr Vickki Muff office and has an infection with left foot. Patient informed that Dr Vickki Muff debrided some of the toes.The patient informed that home health did not come in the home from the last procedure.Patient wanted to know if Dr Delana Meyer will be able to open the blood flow with left foot. The patient is requesting for Dr Delana Meyer to contact Dr Vickki Muff to discuss the next steps. I will let Dr Delana Meyer know medical information when he returns int he office,

## 2020-09-20 ENCOUNTER — Other Ambulatory Visit (INDEPENDENT_AMBULATORY_CARE_PROVIDER_SITE_OTHER): Payer: Self-pay | Admitting: Vascular Surgery

## 2020-09-20 DIAGNOSIS — Z9862 Peripheral vascular angioplasty status: Secondary | ICD-10-CM

## 2020-09-20 DIAGNOSIS — I70261 Atherosclerosis of native arteries of extremities with gangrene, right leg: Secondary | ICD-10-CM

## 2020-09-21 ENCOUNTER — Ambulatory Visit (INDEPENDENT_AMBULATORY_CARE_PROVIDER_SITE_OTHER): Payer: Medicare Other | Admitting: Vascular Surgery

## 2020-09-21 ENCOUNTER — Encounter (INDEPENDENT_AMBULATORY_CARE_PROVIDER_SITE_OTHER): Payer: Medicare Other

## 2020-09-21 ENCOUNTER — Encounter (INDEPENDENT_AMBULATORY_CARE_PROVIDER_SITE_OTHER): Payer: Self-pay | Admitting: Vascular Surgery

## 2020-09-22 ENCOUNTER — Telehealth (INDEPENDENT_AMBULATORY_CARE_PROVIDER_SITE_OTHER): Payer: Self-pay | Admitting: Vascular Surgery

## 2020-09-22 NOTE — Telephone Encounter (Signed)
Dr. Rogelia Rohrer called from [facility] wanting Dr. Delana Meyer to know patient isn't doing to well and has opted to do hospice care under Dr. Alexandria Lodge.  He wanted to reach out to let him know of this information.  If Dr. Delana Meyer have any questions, he can reach out to Dr. Alexandria Lodge.

## 2020-09-25 IMAGING — CT CT HEAD W/O CM
3 of 6 series · 13 of 47 positions shown, 15 images · non-contrast
Comparison: April 30, 2007

CLINICAL DATA: Lethargy and dizziness

EXAM:
CT HEAD WITHOUT CONTRAST
TECHNIQUE: Contiguous axial images were obtained from the base of the skull
through the vertex without intravenous contrast.

[Series 3: head wo · axial · 0.42mm/px · z∈[-188,-63]mm · 8 of 31 slices shown, 10 images]
[im 3/31  brain]
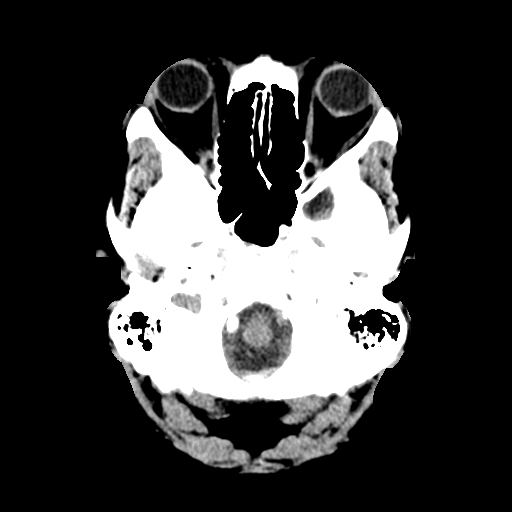
[im 3/31  bone]
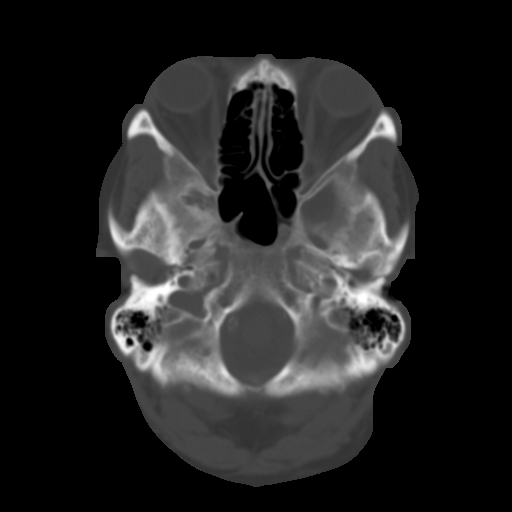
[im 6/31  brain]
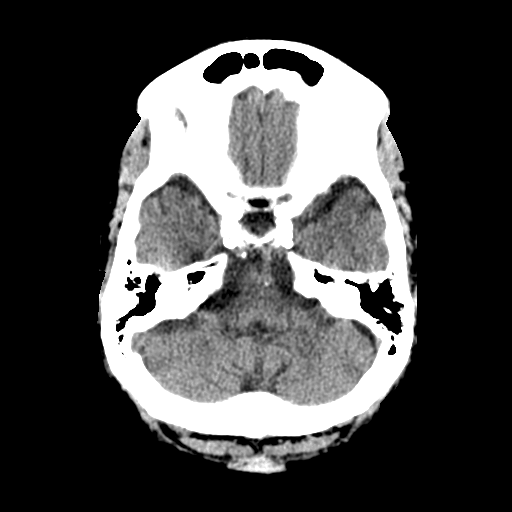
[im 11/31  brain]
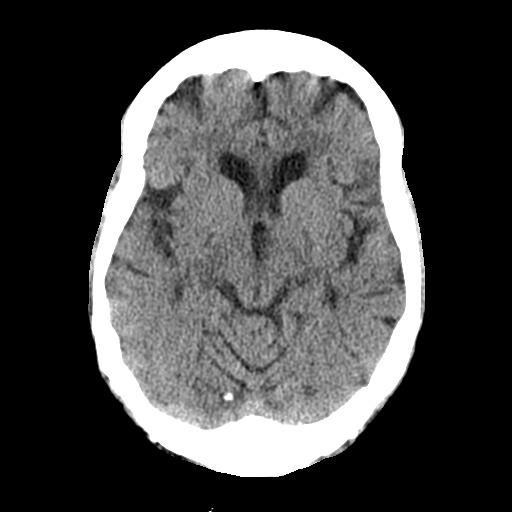
[im 13/31  brain]
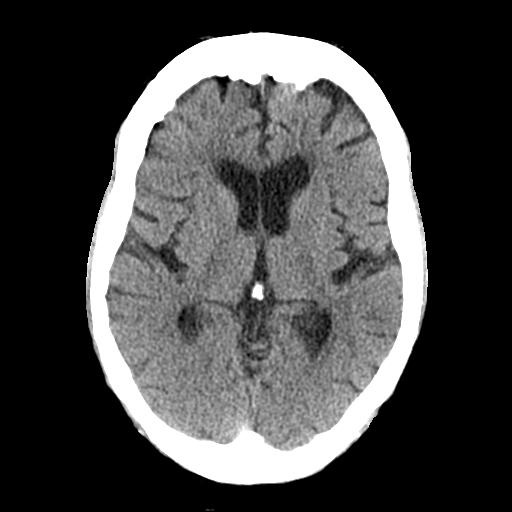
[im 18/31  brain]
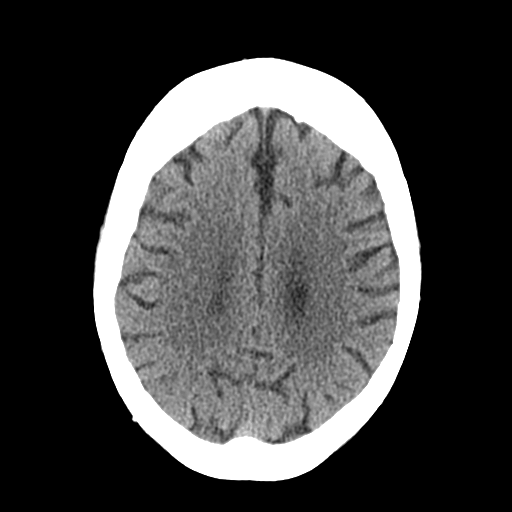
[im 18/31  bone]
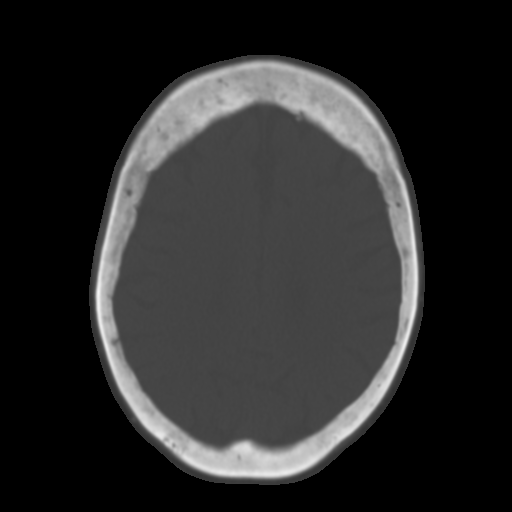
[im 21/31  brain]
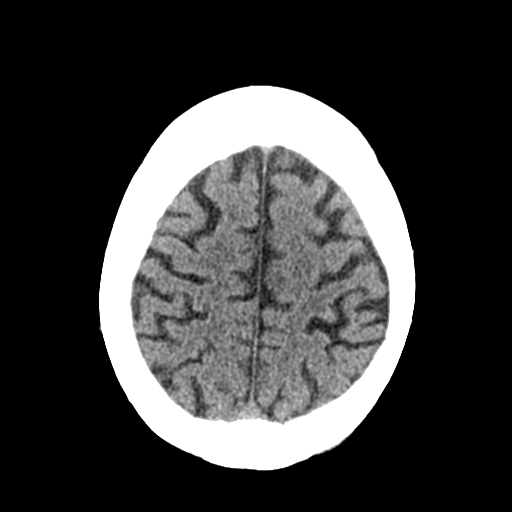
[im 25/31  brain]
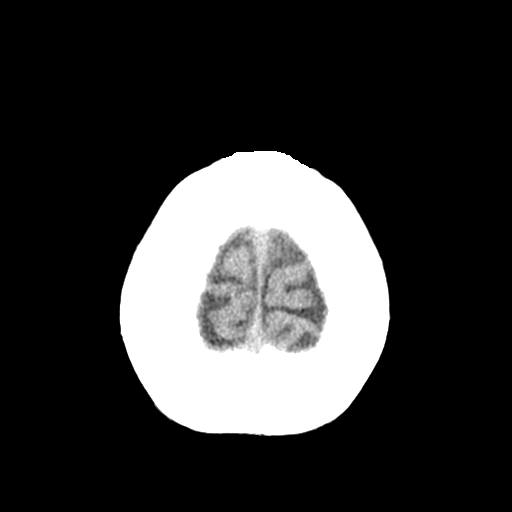
[im 28/31  brain]
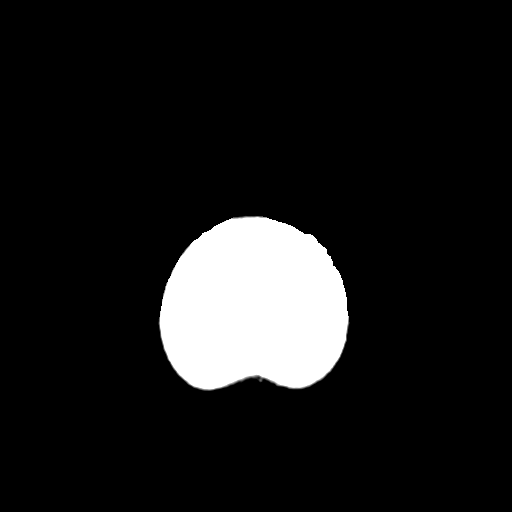

[Series 5: sagittal soft tissue · sagittal · 0.30mm/px · 2 of 54 slices shown]
[im 18/54  brain]
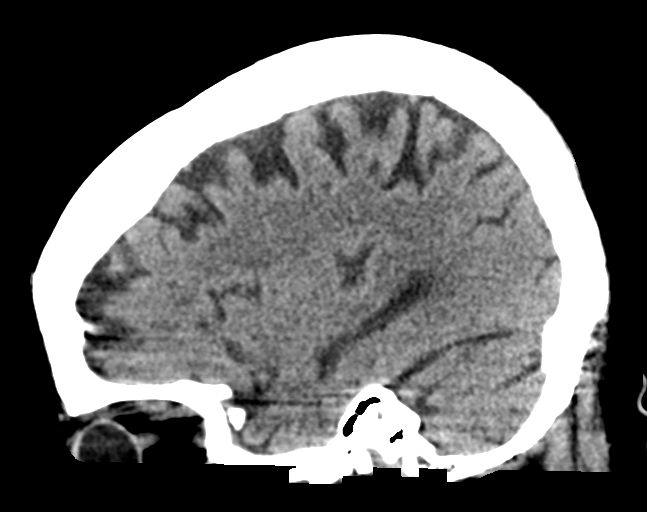
[im 36/54  brain]
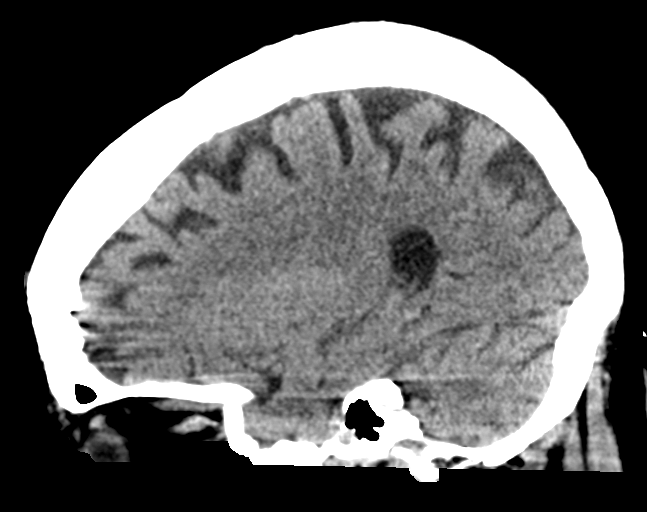

[Series 8: coronal soft tissue · coronal · 0.17mm/px · 3 of 69 slices shown]
[im 37/69  brain]
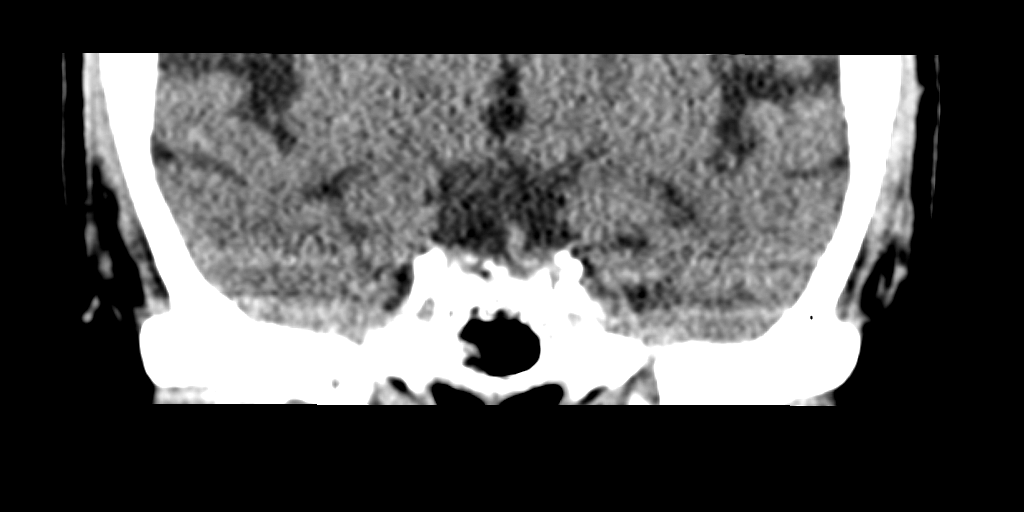
[im 45/69  brain]
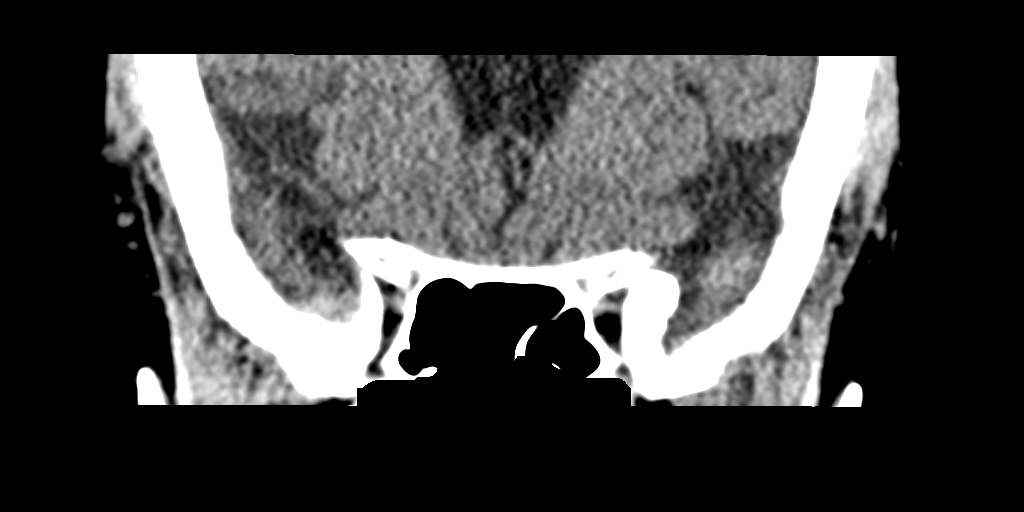
[im 53/69  brain]
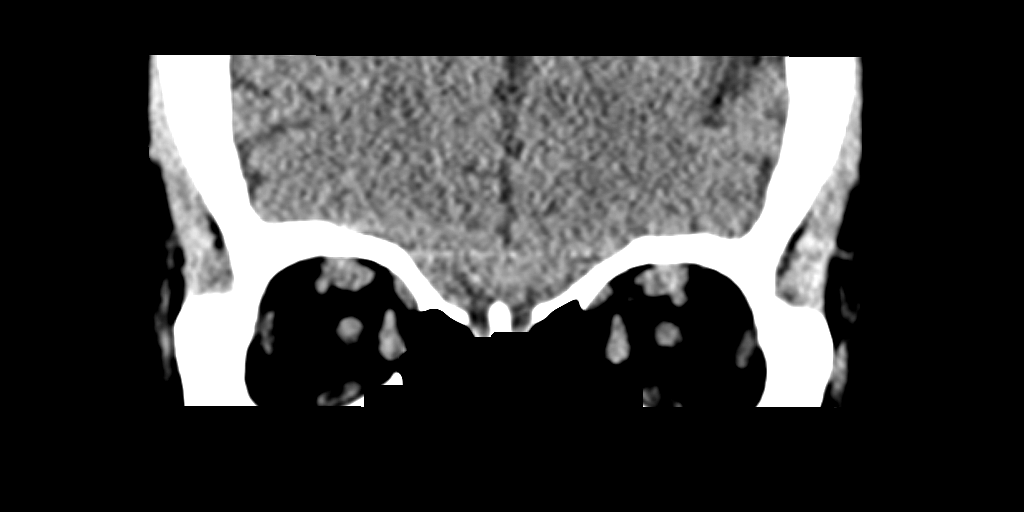

[13 of 47 positions shown; findings below may reference images not displayed]

FINDINGS: Brain: There is mild diffuse atrophy. There is no intracranial mass,
hemorrhage, extra-axial fluid collection, or midline shift. There is
mild small vessel disease in the centra semiovale bilaterally.
Elsewhere brain parenchyma appears unremarkable. No evident acute
infarct.

Vascular: There is no hyperdense vessel. There is calcification in
the distal vertebral arteries and carotid siphon regions
bilaterally.

Skull: The bony calvarium appears intact.

Sinuses/Orbits: Visualized paranasal sinuses are clear. Visualized
orbits appear symmetric bilaterally.

Other: Visualized mastoid air cells are clear.
IMPRESSION: Mild atrophy with periventricular small vessel disease. No evident
acute infarct. No mass or hemorrhage.

There are foci of arterial vascular calcification.

## 2020-10-16 DEATH — deceased
